# Patient Record
Sex: Female | Born: 1960 | Race: White | Hispanic: No | Marital: Married | Smoking: Never smoker
Health system: Southern US, Community
[De-identification: ages and names within clinical notes are randomized; demographics above are authoritative.]

## PROBLEM LIST (undated history)

## (undated) DIAGNOSIS — K811 Chronic cholecystitis: Secondary | ICD-10-CM

## (undated) DIAGNOSIS — Z8719 Personal history of other diseases of the digestive system: Secondary | ICD-10-CM

## (undated) DIAGNOSIS — R52 Pain, unspecified: Secondary | ICD-10-CM

## (undated) DIAGNOSIS — K219 Gastro-esophageal reflux disease without esophagitis: Secondary | ICD-10-CM

## (undated) HISTORY — PX: ABDOMINAL HYSTERECTOMY: SHX81

---

## 1994-05-07 HISTORY — PX: ABDOMINAL HYSTERECTOMY: SHX81

## 1998-04-18 ENCOUNTER — Encounter: Admission: RE | Admit: 1998-04-18 | Discharge: 1998-07-17 | Payer: Self-pay | Admitting: Anesthesiology

## 1998-08-30 ENCOUNTER — Other Ambulatory Visit: Admission: RE | Admit: 1998-08-30 | Discharge: 1998-08-30 | Payer: Self-pay | Admitting: Obstetrics and Gynecology

## 2001-06-18 ENCOUNTER — Encounter (INDEPENDENT_AMBULATORY_CARE_PROVIDER_SITE_OTHER): Payer: Self-pay | Admitting: Specialist

## 2001-06-18 ENCOUNTER — Observation Stay (HOSPITAL_COMMUNITY): Admission: RE | Admit: 2001-06-18 | Discharge: 2001-06-19 | Payer: Self-pay | Admitting: Obstetrics and Gynecology

## 2003-07-24 ENCOUNTER — Emergency Department (HOSPITAL_COMMUNITY): Admission: EM | Admit: 2003-07-24 | Discharge: 2003-07-24 | Payer: Self-pay

## 2004-08-09 ENCOUNTER — Encounter: Admission: RE | Admit: 2004-08-09 | Discharge: 2004-08-09 | Payer: Self-pay | Admitting: Family Medicine

## 2006-06-03 ENCOUNTER — Encounter: Admission: RE | Admit: 2006-06-03 | Discharge: 2006-06-03 | Payer: Self-pay | Admitting: Obstetrics and Gynecology

## 2008-04-07 ENCOUNTER — Encounter: Admission: RE | Admit: 2008-04-07 | Discharge: 2008-04-07 | Payer: Self-pay | Admitting: Cardiology

## 2009-10-24 ENCOUNTER — Ambulatory Visit (HOSPITAL_BASED_OUTPATIENT_CLINIC_OR_DEPARTMENT_OTHER): Admission: RE | Admit: 2009-10-24 | Discharge: 2009-10-24 | Payer: Self-pay | Admitting: Family Medicine

## 2009-10-24 ENCOUNTER — Ambulatory Visit: Payer: Self-pay | Admitting: Diagnostic Radiology

## 2010-09-22 NOTE — Op Note (Signed)
[facility]  Patient:    Elaine Fletcher, Elaine Fletcher Visit Number: [id number] MRN: [id number]          Service Type: DSU Location: [id number] Attending Physician:  Morene Antu Dictated by:   Sherry A. Rosalio Macadamia, M.D. Proc. Date: 06/18/01 Admit Date:  06/18/2001                             Operative Report  PREOPERATIVE DIAGNOSES:       1. Menorrhagia.                               2. Fibroid uterus.                               3. Left ovarian cyst.  POSTOPERATIVE DIAGNOSES:      1. Menorrhagia.                               2. Fibroid uterus.                               3. Left ovarian cyst.                               4. Left endometrioma.  PROCEDURES:                   1. Laparoscopically assisted vaginal                                  hysterectomy.                               2. Left salpingo-oophorectomy.                               3. Right salpingectomy.  SURGEON:                      1. Sherry A. Rosalio Macadamia, M.D.                               2. Genia Del, M.D.  ANESTHESIA:                   General.  INDICATIONS:                  This is a 50 year old G2, P2-0-0-2 woman who has had excessively heavy flow which has been getting worse for the past few years.  The patient has had known fibroids for a few years.  However, they have increased in size recently.  The patient also has left lower quadrant pain with a persistent left ovarian cyst, although the left ovarian cyst has gotten slightly smaller and has been persistent for a couple of years. Because of this, the patient is brought to the operating room for LAVH and LSO.  FINDINGS:  A 10-week-sized, anteflexed uterus with irregular fibroids present.  Normal right ovary.  Normal left fallopian tube status post Falope ring.  Left ovary with approximately 3 cm endometrioma present.  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  Normal appendix.  Normal upper exploration with full-looking gallbladder.  DESCRIPTION OF PROCEDURE:     The patient was brought into the operating room and given adequate general anesthesia.  She was placed in the dorsal lithotomy position.  Her abdomen perineum and vagina were washed with Hibiclens.  A Foley catheter was placed in the bladder.  A speculum was placed in the vagina.  A single-tooth Hulka tenaculum was placed in the cervix in anteflexed fashion.  The speculum was removed.  The surgeons gown and gloves were changed.  The patient was draped in a sterile fashion.  A subumbilical incision was infiltrated with 0.25% Marcaine.  An incision was made.  A Veress needle was introduced into the peritoneal cavity.  Its placement was checked with saline.  Approximately 3 L of carbon dioxide was insufflated.  The Veress needle was removed.  A laparoscopic trocar was introduced into the peritoneal cavity without difficulty.  A laparoscope was placed and positive identification of the pelvic organs was made.  A right lateral incision was made after infiltrating with 0.25% Marcaine and a trocar was placed under direct visualization.  The same procedure was performed on the left.  The pelvis was inspected and pictures were obtained.  It was felt that an LAVH could be performed.  The right round ligament was cauterized with the tripolar cautery in three places and cut in the middle.  The cautery was continued down to the fallopian tube.  Initially, it was felt that the fallopian tube should be removed only at the portion from the Falope ring back, so cautery was performed beneath the fallopian tube to be included with the specimen.  The utero-ovarian ligaments were cauterized with tripolar and cut.  Some bleeding was present from back bleeding.  This was cauterized for hemostasis.  The ureters on both sides were visualized and were deep in the pelvis.  The  left tube and ovary were to be removed.  The left round ligament was then cauterized with tripolar in three places and cut in the middle.  The left infundibulopelvic ligament was identified.  It was cauterized x 3 in successive bites then cut.  Dissection was continued beneath the ovary to the uterus.  All bleeders were cauterized.  The bladder flap was then identified. A small incision was made in it near the cervix and, using the Nezhat, the bladder flap was hydrodissected.  Using Metzenbaums, a peritoneal incision was made from this midline incision on both sides.  The bladder was then dissected with blunt dissection down off of the cervix.  Bleeders were cauterized. Adequate hemostasis was felt to be present.  The ureters were then again re identified and some more cautery was taken with the tripolar, still above the uterine arteries, but close in to the uterus and at the cervicouterine junction.  This was done on both sides.  Adequate hemostasis was present at this time.  The ureters were felt to be below the cautery area and were felt to be normal.  The carbon dioxide was allowed to escape and the instruments removed from the abdominal cavity.  The vaginal portion of the surgery was then performed.  The patient was prepared for the vaginal procedure.  A weighted  speculum was placed in the vagina.  The cervix was grasped with two Loman [place].  The cervix was infiltrated with 1% Xylocaine with epinephrine circumferentially. The cervix was then circumcised and the vaginal mucosa was dissected off the cervix with blunt dissection.  The posterior cul-de-sac was entered sharply and a weighted speculum was placed within this space after removing the first one.  Using Heaney clamps, the uterosacral ligaments were clamped, cut and suture ligated with 0 Vicryl ligature.  Then, using the LigaSure, tissues were clamped, cauterized and then cut on alternating sides.  The bladder  was developed off of the lower uterine segment with blunt dissection and a retractor was placed beneath the bladder.  The LigaSure was continued to be  used on alternating sides until all of the cardinal ligaments were cauterized and cut.  The uterus was removed with the left tube and ovary intact. However, the endometrioma had ruptured.  The pelvis was irrigated and suctioned.  Small bleeders were present along some of the cauterized edges. These were closed with 0 Vicryl figure-of-eight stitches.  The posterior cuff was then closed with 0 Vicryl in a running lock stitch.  A McCall stitch was taken with 0 Vicryl from the uterosacral ligaments across the rectum and tied in the midline.  The bladder peritoneum was identified and the bladder peritoneum was closed with the vaginal mucosa in figure-of-eight stitches with 0 Vicryl at the anterior portion.  Then, the entire vaginal cuff was closed with 0 Vicryl in figure-of-eight stitches in a vertical fashion.  Adequate hemostasis was present.  All instruments were removed from the vagina.  The patient was then prepared for the second laparoscopic portion of the surgery. The surgeons gloves were changed.  The carbon dioxide was then allowed rule out insufflate and the laparoscope was placed.  There was the hydatid of Morgagni on the right fallopian tube. Initially, we were going to take out the cyst.  However, we felt that there would be too much bleeding.  Therefore, the tube was elevated and it was cauterized with tripolar cautery beneath it, leaving the ovary intact.  The tissue was cauterized and then cut.  Adequate hemostasis was present.  The ovary was felt to be normal.  The infundibulopelvic ligament was felt to be untouched.  The pelvis was irrigated.  There were small bleeders in certain areas near the bladder peritoneum.  These were cauterized.  There was a small amount of bleeding near the cuff of the vagina.  A small piece of  Surgicel was cut and was introduced through the left port.  The Surgicel was placed in the cuff.  Adequate hemostasis was then present.  The pelvis had been irrigated. The upper abdomen was inspected.  The appendix was visualized.  All irrigation had been removed.  Adequate hemostasis was then again noted.  The right trocar and sleeve were removed under direct visualization.  The laparoscope was removed under direct visualization after removing all carbon dioxide.  The left sleeve was removed after all carbon dioxide had escaped.  The subumbilical incision was then closed by grasping the fascia with an Allis clamp.  The fascia was closed with 0 Vicryl in an interrupted stitch.  The skin was then closed with 4-0 Monocryl in a subcuticular stitch in the subumbilical incision.  All three incisions were then closed with Dermabond. Adequate hemostasis was present.  The patient was taken out of the dorsal lithotomy position.  She was awakened.  She was extubated.  She  was moved from the operating table to a stretcher in stable condition.  COMPLICATIONS:                None.  ESTIMATED BLOOD LOSS:         100 cc. Dictated by:   Sherry A. Rosalio Macadamia, M.D. Attending Physician:  Morene Antu DD:  06/18/01 TD:  06/18/01 Job: [id number]

## 2011-01-12 ENCOUNTER — Other Ambulatory Visit: Payer: Self-pay

## 2011-01-12 ENCOUNTER — Encounter: Payer: Self-pay | Admitting: Emergency Medicine

## 2011-01-12 ENCOUNTER — Emergency Department (HOSPITAL_BASED_OUTPATIENT_CLINIC_OR_DEPARTMENT_OTHER)
Admission: EM | Admit: 2011-01-12 | Discharge: 2011-01-12 | Disposition: A | Discharge status: Home / Self Care | Payer: BC Managed Care – PPO | Attending: Emergency Medicine | Admitting: Emergency Medicine

## 2011-01-12 ENCOUNTER — Emergency Department (INDEPENDENT_AMBULATORY_CARE_PROVIDER_SITE_OTHER): Payer: BC Managed Care – PPO

## 2011-01-12 DIAGNOSIS — M549 Dorsalgia, unspecified: Secondary | ICD-10-CM

## 2011-01-12 DIAGNOSIS — R079 Chest pain, unspecified: Secondary | ICD-10-CM | POA: Insufficient documentation

## 2011-01-12 DIAGNOSIS — R091 Pleurisy: Secondary | ICD-10-CM

## 2011-01-12 DIAGNOSIS — R0602 Shortness of breath: Secondary | ICD-10-CM

## 2011-01-12 LAB — COMPREHENSIVE METABOLIC PANEL
Alkaline Phosphatase: 72 U/L (ref 39–117)
BUN: 14 mg/dL (ref 6–23)
Creatinine, Ser: 0.7 mg/dL (ref 0.50–1.10)
GFR calc Af Amer: >60 mL/min (ref >60)
Glucose, Bld: 96 mg/dL (ref 70–99)
Potassium: 4.1 mEq/L (ref 3.5–5.1)
Total Protein: 6.6 g/dL (ref 6.0–8.3)

## 2011-01-12 LAB — CBC
HCT: 38.8 % (ref 36.0–46.0)
Hemoglobin: 13.3 g/dL (ref 12.0–15.0)
MCH: 31.1 pg (ref 26.0–34.0)
MCHC: 34.3 g/dL (ref 30.0–36.0)
MCV: 90.9 fL (ref 78.0–100.0)

## 2011-01-12 LAB — CARDIAC PANEL(CRET KIN+CKTOT+MB+TROPI)
Relative Index: INVALID (ref 0.0–2.5)
Total CK: 64 U/L (ref 7–177)

## 2011-01-12 MED ORDER — KETOROLAC TROMETHAMINE 30 MG/ML IJ SOLN
30.0000 mg | Freq: Once | INTRAMUSCULAR | Status: AC
  Administered 2011-01-12: 30 mg via INTRAVENOUS
  Filled 2011-01-12: qty 1

## 2011-01-12 MED ORDER — IOHEXOL 350 MG/ML SOLN
80.0000 mL | Freq: Once | INTRAVENOUS | Status: AC | PRN
  Administered 2011-01-12: 80 mL via INTRAVENOUS

## 2011-01-12 MED ORDER — TRAMADOL HCL 50 MG PO TABS: 50.0000 mg | ORAL_TABLET | Freq: Four times a day (QID) | ORAL | Status: AC | PRN

## 2011-01-12 NOTE — ED Notes (Signed)
Pt c/o awaking with pain in left axillary area x several days. Pt reports pain happens at night and resolves by morning. Pt reports shob associated with pain and pain increased with inspiration.

## 2011-01-12 NOTE — ED Provider Notes (Addendum)
History     CSN: [id number] Arrival date & time: 01/12/2011  4:14 AM  Chief Complaint  Patient presents with  . Chest Pain   Patient is a 50 y.o. female presenting with chest pain. The history is provided by the patient. No language interpreter was used.  Chest Pain The chest pain began more than 2 weeks ago (3 weeks ago). Duration of episode(s) is 3 weeks. Chest pain occurs constantly. The pain is associated with breathing (laying flat). At its most intense, the pain is at 8/10. The pain is currently at 8/10. The quality of the pain is described as sharp and pressure-like. Radiates to: left chest into the left axilla. Chest pain is worsened by certain positions and deep breathing. Primary symptoms include shortness of breath and cough. Pertinent negatives for primary symptoms include no fever, no fatigue, no syncope, no wheezing, no palpitations, no abdominal pain, no nausea, no vomiting, no dizziness and no altered mental status.  Pertinent negatives for associated symptoms include no claudication, no diaphoresis, no lower extremity edema, no near-syncope, no numbness, no orthopnea, no paroxysmal nocturnal dyspnea and no weakness. She tried nothing for the symptoms. Risk factors include no known risk factors.  Pertinent negatives for past medical history include no aneurysm, no anxiety/panic attacks, no aortic aneurysm, no aortic dissection, no arrhythmia, no bicuspid aortic valve, no CAD, no cancer, no congenital heart disease, no connective tissue disease, no COPD, no CHF, no diabetes, no DVT, no hyperlipidemia, no Marfan's syndrome, no MI, no mitral valve prolapse, no pacemaker, no PE, no PVD, no recent injury, no rheumatic fever, no seizures, no sickle cell disease, no sleep apnea, no spontaneous pneumothorax, no stimulant use, no strokes and no thyroid problem.  Her family medical history is significant for heart disease in family.  Pertinent negatives for family medical history include: no  sudden death in family.  Procedure history is negative for cardiac catheterization, echocardiogram, persantine thallium, stress echo, stress thallium and exercise treadmill test.     History reviewed. No pertinent past medical history.  Past Surgical History  Procedure Date  . Abdominal hysterectomy     History reviewed. No pertinent family history.  History  Substance Use Topics  . Smoking status: Never Smoker   . Smokeless tobacco: Not on file  . Alcohol Use: No    OB History    Grav Para Term Preterm Abortions TAB SAB Ect Mult Living                  Review of Systems  Constitutional: Negative for fever, diaphoresis and fatigue.  Respiratory: Positive for cough and shortness of breath. Negative for wheezing.   Cardiovascular: Positive for chest pain. Negative for palpitations, orthopnea, claudication, syncope and near-syncope.  Gastrointestinal: Negative for nausea, vomiting, abdominal pain and abdominal distention.  Genitourinary: Negative for dysuria and difficulty urinating.  Musculoskeletal: Negative.   Neurological: Negative for dizziness, seizures, weakness and numbness.  Hematological: Negative.   Psychiatric/Behavioral: Negative.  Negative for altered mental status.    Physical Exam  BP 128/62  Pulse 80  Temp(Src) 97.9 F (36.6 C) (Oral)  Resp 16  SpO2 100%  Physical Exam  Constitutional: She is oriented to person, place, and time. She appears well-developed and well-nourished.  HENT:  Head: Normocephalic and atraumatic.  Eyes: EOM are normal. Pupils are equal, round, and reactive to light. Right eye exhibits no discharge. Left eye exhibits no discharge.  Neck: Normal range of motion. Neck supple.  Cardiovascular: Normal rate and  regular rhythm.  Exam reveals no gallop and no friction rub.   No murmur heard. Pulmonary/Chest: Effort normal and breath sounds normal. No respiratory distress. She exhibits tenderness.       Left midaxillary line    Abdominal: Soft. Bowel sounds are normal.  Musculoskeletal: Normal range of motion. She exhibits no edema and no tenderness.  Neurological: She is alert and oriented to person, place, and time.  Skin: Skin is warm.  Psychiatric: She has a normal mood and affect.    ED Course  Procedures  MDM  Date: 01/12/2011  Rate:69  Rhythm: normal sinus rhythm  QRS Axis: normal  Intervals: normal  ST/T Wave abnormalities: normal  Conduction Disutrbances:none  Narrative Interpretation: normal  Old EKG Reviewed: none available    Return for worsening chest pain, shortness of breath or any concerns.  Follow up with your family doctor and cardiology for outpatient stress test, patient expresses understanding and agrees to follow up   Jari Carollo K Dyneshia Baccam-Rasch, MD 01/12/11 [id number]  Jasmine Awe, MD 01/12/11 [phone]

## 2013-08-04 ENCOUNTER — Other Ambulatory Visit: Payer: Self-pay | Admitting: Family

## 2013-08-04 DIAGNOSIS — Z1231 Encounter for screening mammogram for malignant neoplasm of breast: Secondary | ICD-10-CM

## 2013-09-05 IMAGING — CT CT ANGIO CHEST
2 of 6 series · 19 of 36 positions shown · IV contrast (APPLIED)
Comparison: None.

CLINICAL DATA: Left chest pain, back pain, short of breath, recent
long airplane trip.

CT ANGIOGRAPHY CHEST WITH CONTRAST
TECHNIQUE: Multidetector CT imaging of the chest was performed
using the standard protocol during bolus administration of
intravenous contrast.  Multiplanar CT image reconstructions
including MIPs were obtained to evaluate the vascular anatomy.
Contrast:  80 ml Omnipaque 3 feet D

[Series 5: pe 1.0 b25f · axial · 0.62mm/px · z∈[-234,-9]mm · 18 of 251 slices shown]
[im 13/251  lung]
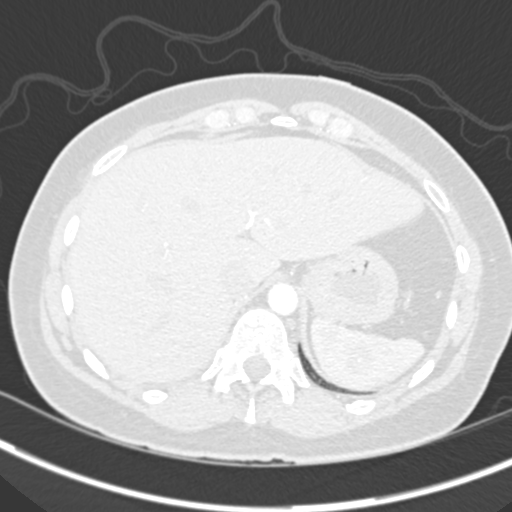
[im 26/251  mediastinal]
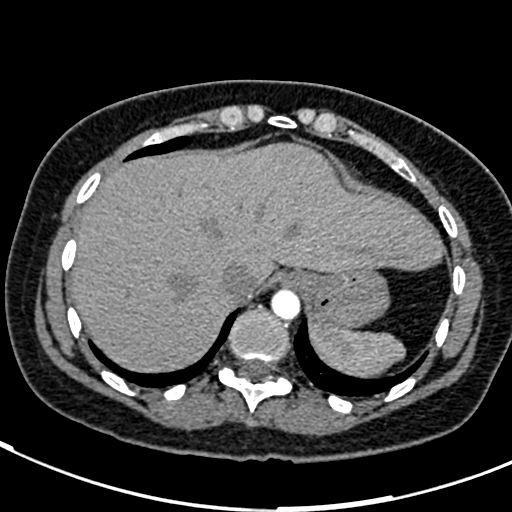
[im 38/251  lung]
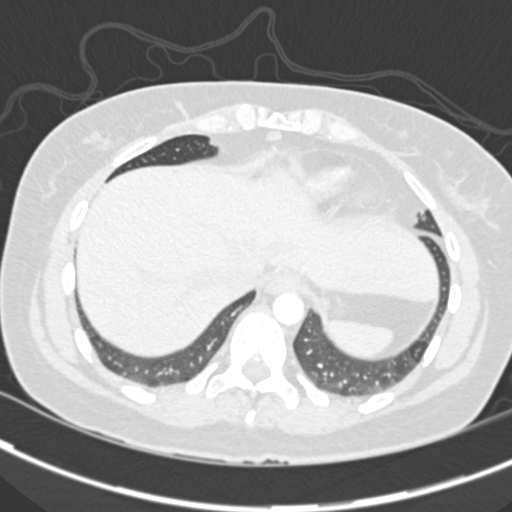
[im 51/251  mediastinal]
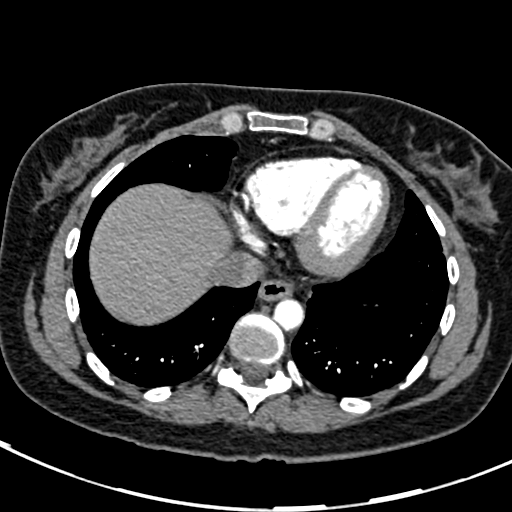
[im 63/251  lung]
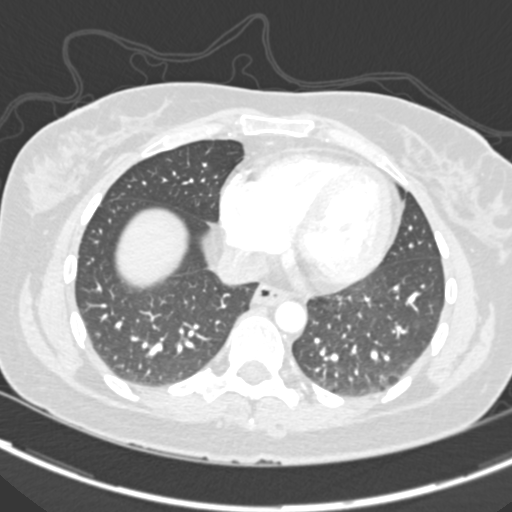
[im 76/251  mediastinal]
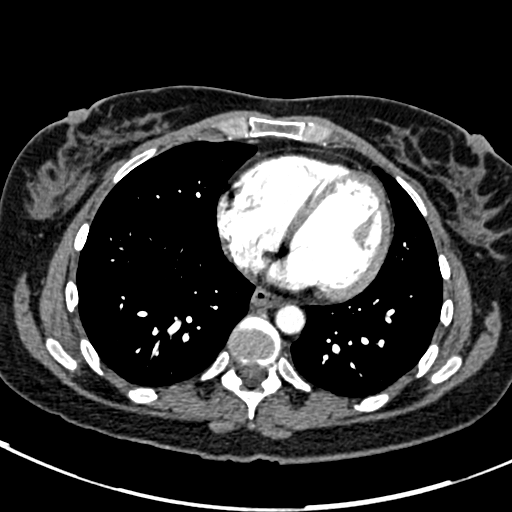
[im 88/251  lung]
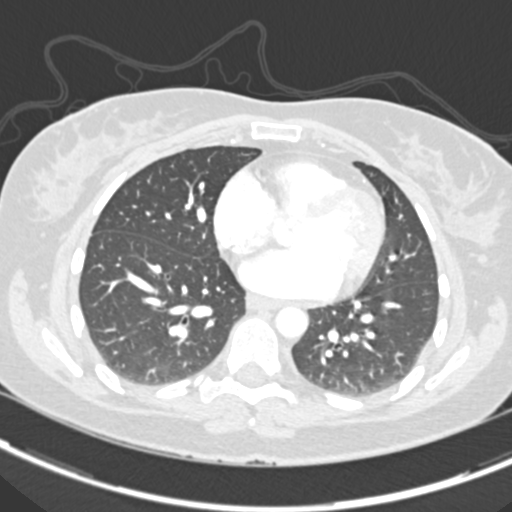
[im 101/251  mediastinal]
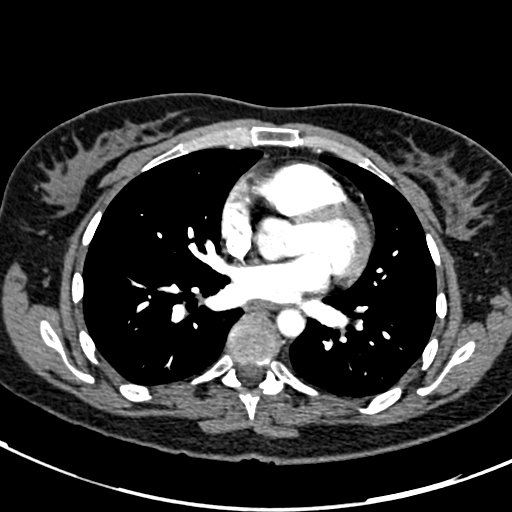
[im 113/251  lung]
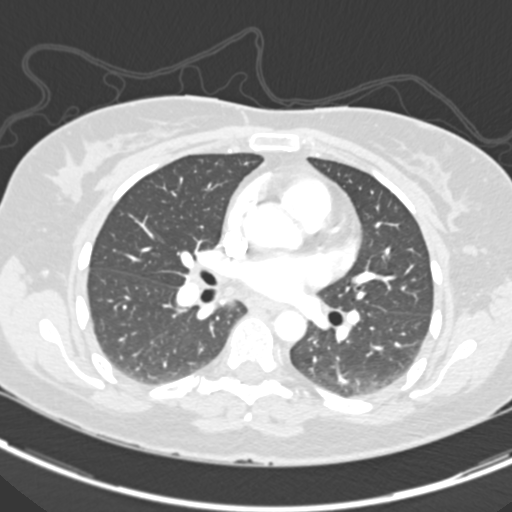
[im 138/251  mediastinal]
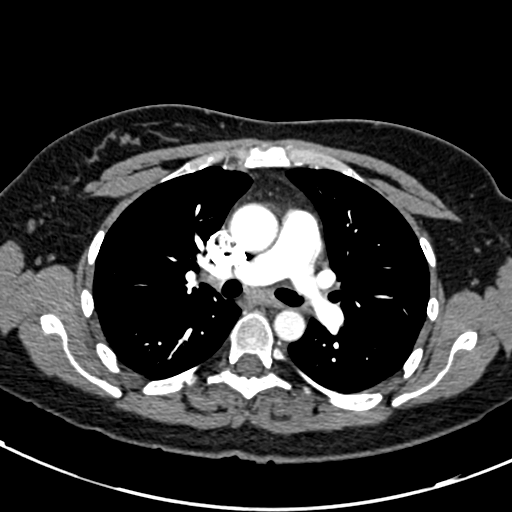
[im 151/251  lung]
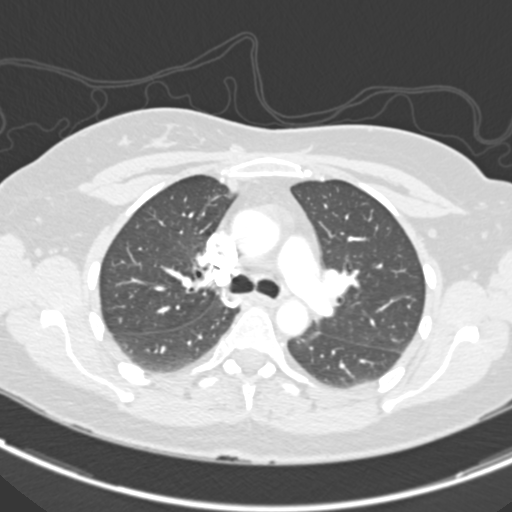
[im 163/251  mediastinal]
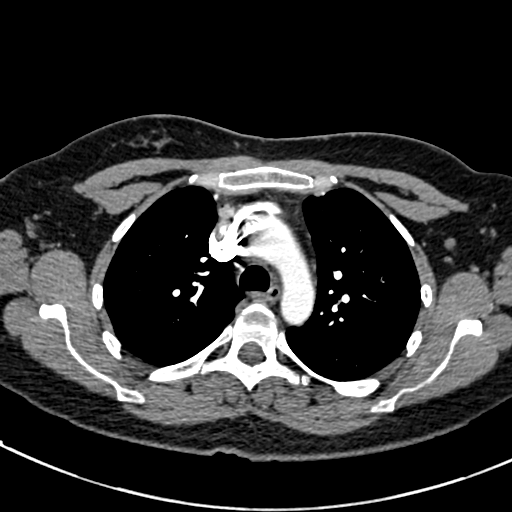
[im 176/251  lung]
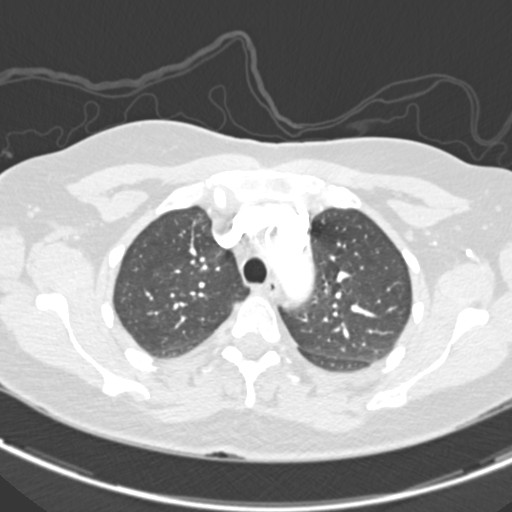
[im 188/251  mediastinal]
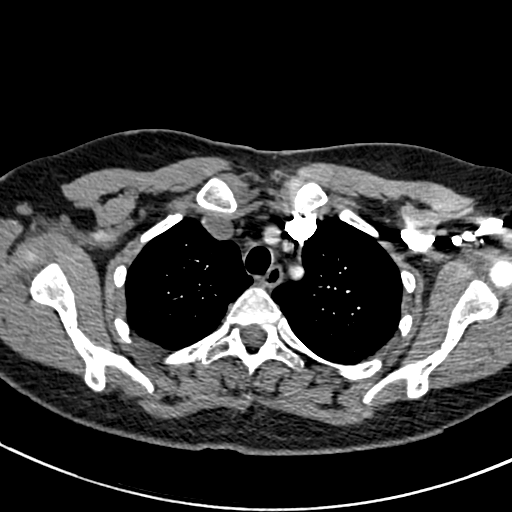
[im 201/251  lung]
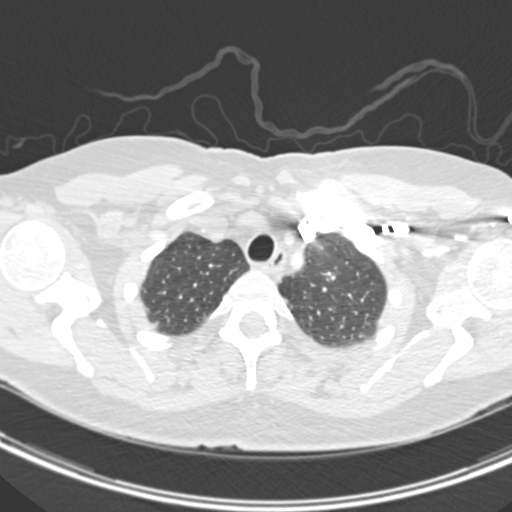
[im 213/251  mediastinal]
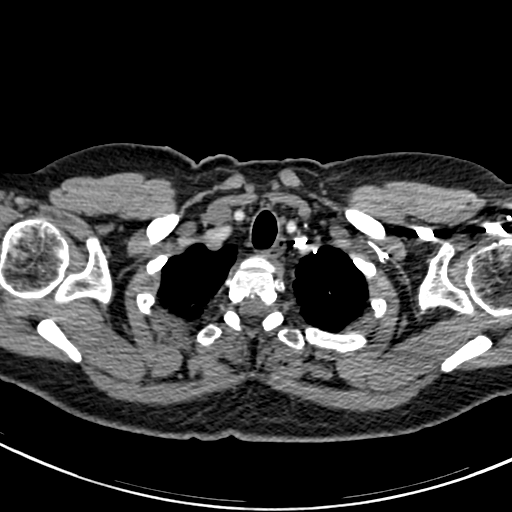
[im 226/251  lung]
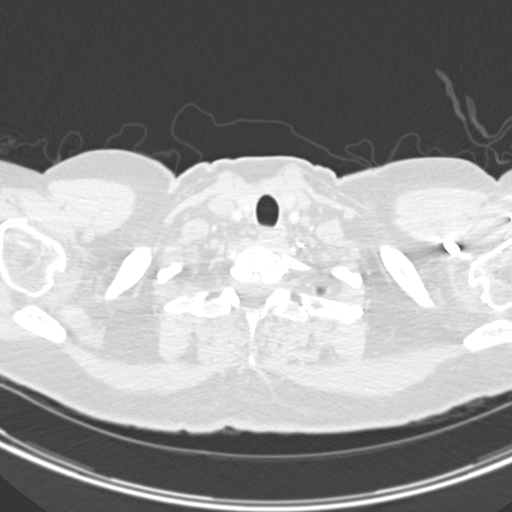
[im 238/251  mediastinal]
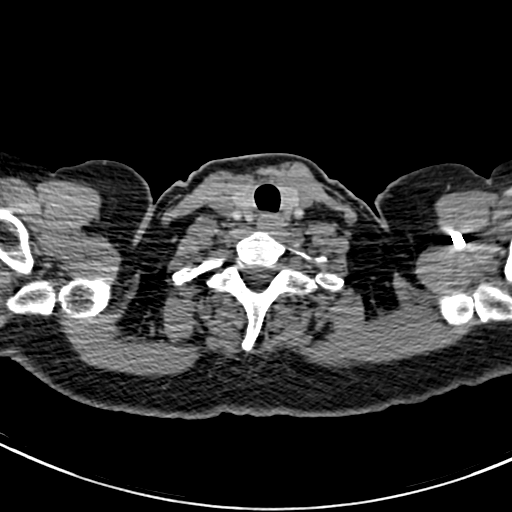

[Series 8: pe 2.0 coronal · coronal · 0.49mm/px · 1 of 111 slices shown]
[im 56/111  mediastinal]
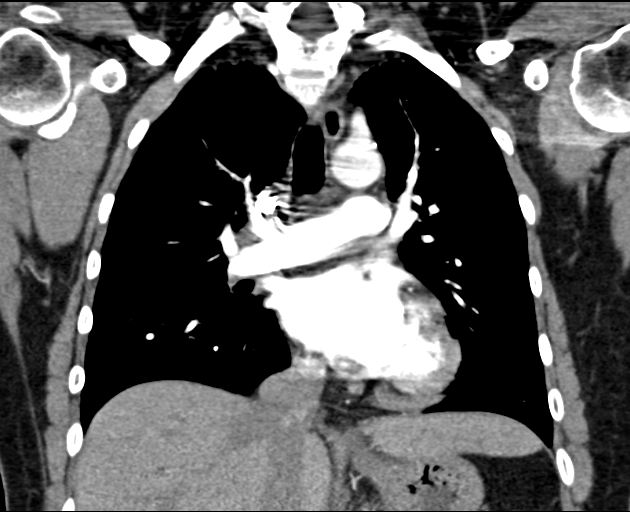

[19 of 36 positions shown; findings below may reference images not displayed]

FINDINGS: Technically adequate study with good opacification of
the central and segmental pulmonary arteries.  No focal filling
defects demonstrated.  No evidence of significant pulmonary
embolus.  Normal caliber thoracic aorta without dissection.  No
significant lymphadenopathy in the chest.  No pleural effusions.
Heterogeneous splenic enhancement probably related to early
arterial phase contrast bolus.  Small bleb in the medial aspect of
the right upper lung.  No focal airspace consolidation.  Minimal
dependent atelectasis.  Normal alignment of the thoracic spine.
Sternum appears intact.

Review of the MIP images confirms the above findings.
IMPRESSION: No evidence of significant pulmonary embolus.

## 2013-09-05 IMAGING — CR DG CHEST 2V
2 series · 2 of 2 positions shown · non-contrast
Comparison: 07/24/2003

CLINICAL DATA: Left chest pain, back pain and short of breath

CHEST - 2 VIEW

[w chest pa]
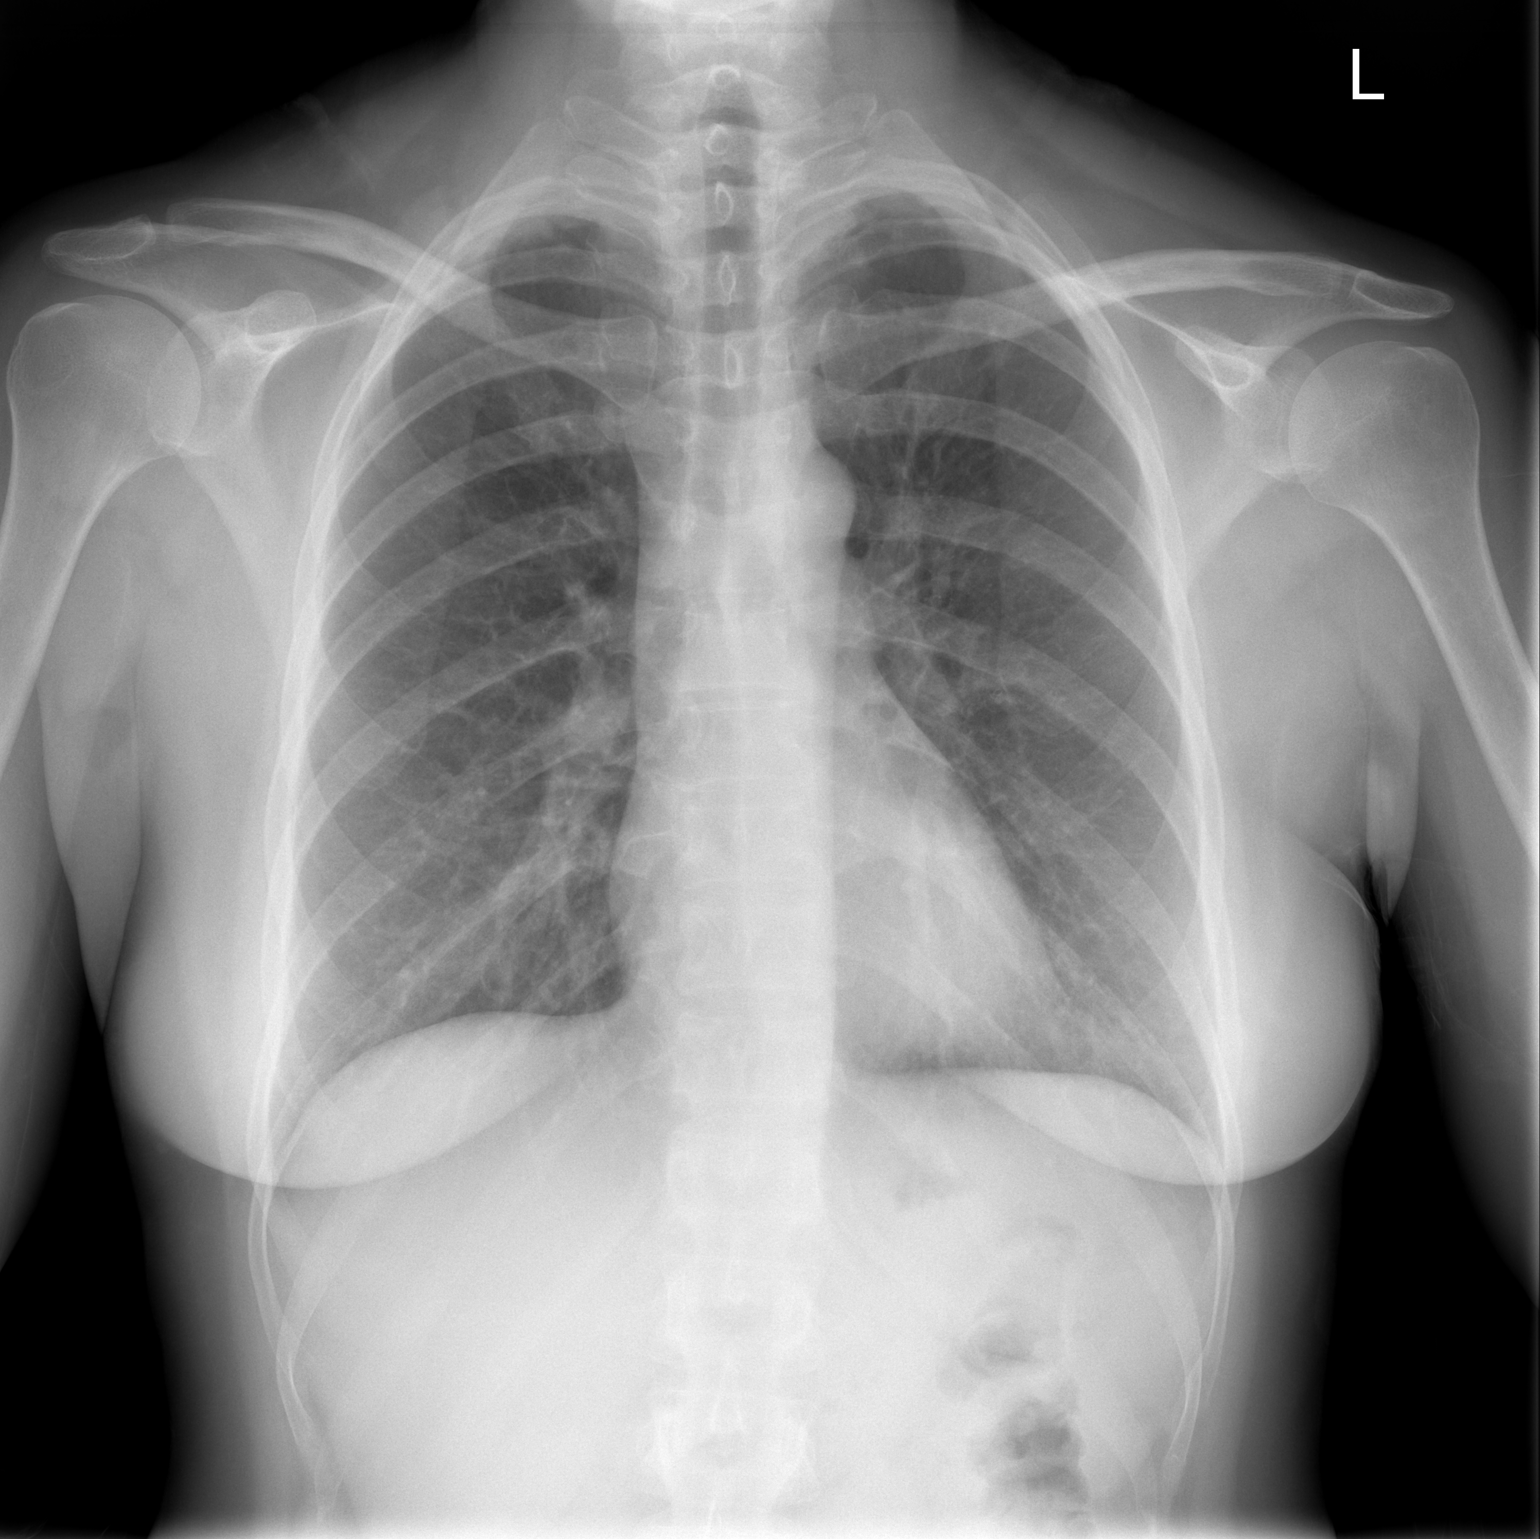

[w chest lat]
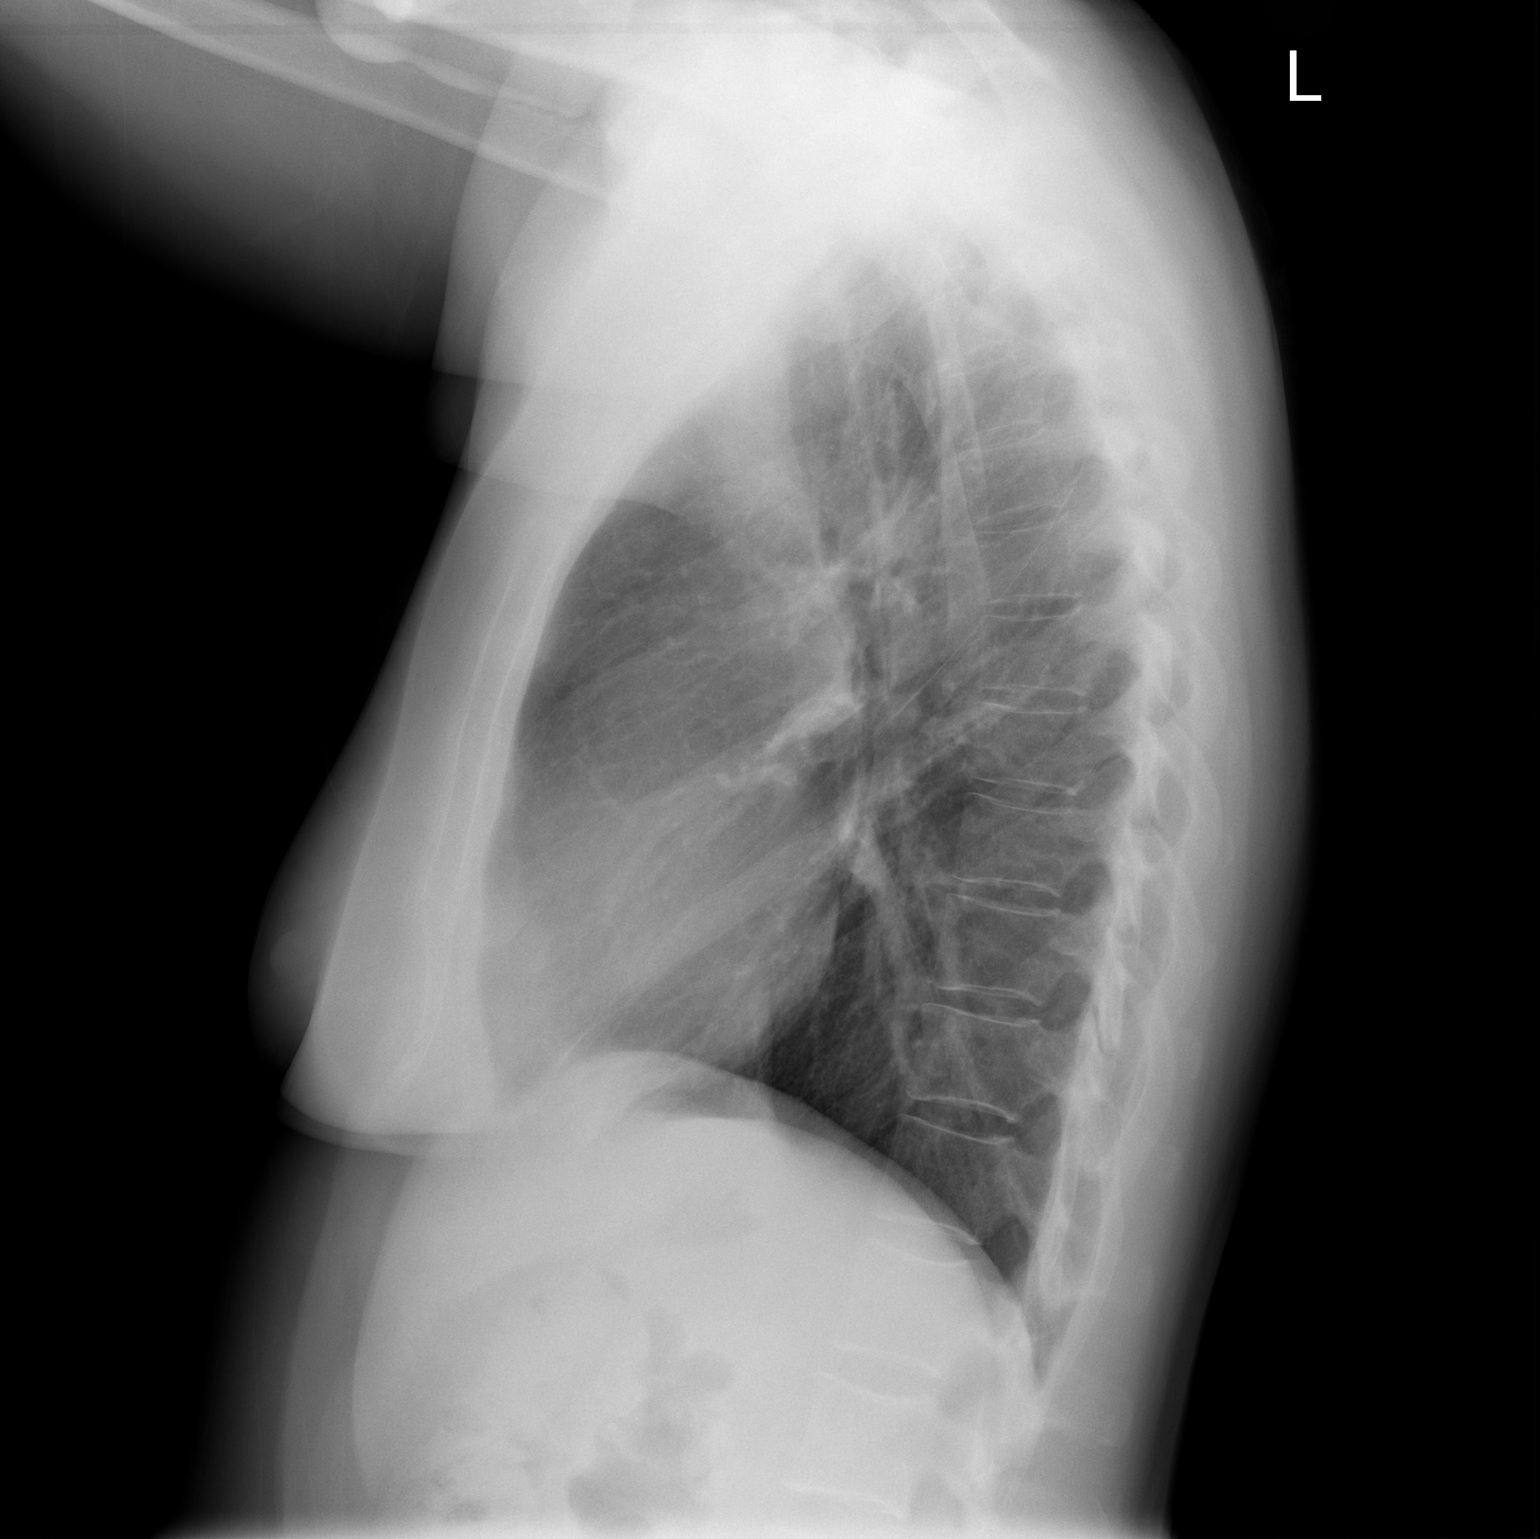

[2 of 2 positions shown; findings below may reference images not displayed]

FINDINGS: The heart size and pulmonary vascularity are normal. The
lungs appear clear and expanded without focal air space disease or
consolidation. No blunting of the costophrenic angles.  No
significant change since previous study
IMPRESSION: No evidence of active pulmonary disease.

## 2013-11-11 ENCOUNTER — Encounter (INDEPENDENT_AMBULATORY_CARE_PROVIDER_SITE_OTHER): Payer: Self-pay

## 2013-11-11 ENCOUNTER — Ambulatory Visit
Admission: RE | Admit: 2013-11-11 | Discharge: 2013-11-11 | Disposition: A | Discharge status: Home / Self Care | Payer: BC Managed Care – PPO | Source: Ambulatory Visit | Attending: Family | Admitting: Family

## 2013-11-11 DIAGNOSIS — Z1231 Encounter for screening mammogram for malignant neoplasm of breast: Secondary | ICD-10-CM

## 2014-02-19 ENCOUNTER — Other Ambulatory Visit: Payer: Self-pay

## 2017-02-13 ENCOUNTER — Ambulatory Visit (HOSPITAL_BASED_OUTPATIENT_CLINIC_OR_DEPARTMENT_OTHER)
Admission: RE | Admit: 2017-02-13 | Discharge: 2017-02-13 | Disposition: A | Discharge status: Home / Self Care | Payer: BC Managed Care – PPO | Source: Ambulatory Visit | Attending: Family Medicine | Admitting: Family Medicine

## 2017-02-13 ENCOUNTER — Other Ambulatory Visit: Payer: Self-pay | Admitting: Family Medicine

## 2017-02-13 DIAGNOSIS — R519 Headache, unspecified: Secondary | ICD-10-CM

## 2017-02-13 DIAGNOSIS — R51 Headache: Secondary | ICD-10-CM | POA: Diagnosis not present

## 2017-02-13 DIAGNOSIS — R42 Dizziness and giddiness: Secondary | ICD-10-CM | POA: Diagnosis present

## 2018-01-30 ENCOUNTER — Other Ambulatory Visit: Payer: Self-pay | Admitting: Sports Medicine

## 2018-01-30 ENCOUNTER — Ambulatory Visit
Admission: RE | Admit: 2018-01-30 | Discharge: 2018-01-30 | Disposition: A | Discharge status: Home / Self Care | Payer: BC Managed Care – PPO | Source: Ambulatory Visit | Attending: Sports Medicine | Admitting: Sports Medicine

## 2018-01-30 DIAGNOSIS — M7989 Other specified soft tissue disorders: Secondary | ICD-10-CM

## 2018-01-30 DIAGNOSIS — M79604 Pain in right leg: Secondary | ICD-10-CM

## 2018-12-12 ENCOUNTER — Other Ambulatory Visit: Payer: Self-pay | Admitting: Family Medicine

## 2018-12-12 DIAGNOSIS — Z1231 Encounter for screening mammogram for malignant neoplasm of breast: Secondary | ICD-10-CM

## 2019-01-28 ENCOUNTER — Other Ambulatory Visit: Payer: Self-pay

## 2019-01-28 ENCOUNTER — Ambulatory Visit
Admission: RE | Admit: 2019-01-28 | Discharge: 2019-01-28 | Disposition: A | Discharge status: Home / Self Care | Payer: BC Managed Care – PPO | Source: Ambulatory Visit | Attending: Family Medicine | Admitting: Family Medicine

## 2019-01-28 DIAGNOSIS — Z1231 Encounter for screening mammogram for malignant neoplasm of breast: Secondary | ICD-10-CM

## 2020-03-14 ENCOUNTER — Other Ambulatory Visit: Payer: Self-pay

## 2020-03-14 ENCOUNTER — Encounter (HOSPITAL_BASED_OUTPATIENT_CLINIC_OR_DEPARTMENT_OTHER): Payer: Self-pay | Admitting: *Deleted

## 2020-03-14 ENCOUNTER — Emergency Department (HOSPITAL_BASED_OUTPATIENT_CLINIC_OR_DEPARTMENT_OTHER)
Admission: EM | Admit: 2020-03-14 | Discharge: 2020-03-14 | Disposition: A | Discharge status: Home / Self Care | Payer: BC Managed Care – PPO | Attending: Emergency Medicine | Admitting: Emergency Medicine

## 2020-03-14 DIAGNOSIS — Z0279 Encounter for issue of other medical certificate: Secondary | ICD-10-CM

## 2020-03-14 DIAGNOSIS — M2540 Effusion, unspecified joint: Secondary | ICD-10-CM | POA: Diagnosis not present

## 2020-03-14 DIAGNOSIS — R21 Rash and other nonspecific skin eruption: Secondary | ICD-10-CM | POA: Insufficient documentation

## 2020-03-14 DIAGNOSIS — R1011 Right upper quadrant pain: Secondary | ICD-10-CM | POA: Insufficient documentation

## 2020-03-14 DIAGNOSIS — M25532 Pain in left wrist: Secondary | ICD-10-CM | POA: Diagnosis present

## 2020-03-14 DIAGNOSIS — M791 Myalgia, unspecified site: Secondary | ICD-10-CM | POA: Insufficient documentation

## 2020-03-14 LAB — COMPREHENSIVE METABOLIC PANEL
ALT: 29 U/L (ref 0–44)
AST: 27 U/L (ref 15–41)
Albumin: 4.2 g/dL (ref 3.5–5.0)
Alkaline Phosphatase: 84 U/L (ref 38–126)
Anion gap: 12 (ref 5–15)
BUN: 15 mg/dL (ref 6–20)
CO2: 24 mmol/L (ref 22–32)
Calcium: 9.4 mg/dL (ref 8.9–10.3)
Chloride: 104 mmol/L (ref 98–111)
Creatinine, Ser: 0.97 mg/dL (ref 0.44–1.00)
GFR, Estimated: >60 mL/min (ref >60)
Glucose, Bld: 94 mg/dL (ref 70–99)
Potassium: 3.9 mmol/L (ref 3.5–5.1)
Sodium: 140 mmol/L (ref 135–145)
Total Bilirubin: 2.5 mg/dL — ABNORMAL HIGH (ref 0.3–1.2)
Total Protein: 7.4 g/dL (ref 6.5–8.1)

## 2020-03-14 LAB — CBC WITH DIFFERENTIAL/PLATELET
Abs Immature Granulocytes: 0.01 10*3/uL (ref 0.00–0.07)
Basophils Absolute: 0.1 10*3/uL (ref 0.0–0.1)
Basophils Relative: 1 %
Eosinophils Absolute: 0.2 10*3/uL (ref 0.0–0.5)
Eosinophils Relative: 4 %
HCT: 44 % (ref 36.0–46.0)
Hemoglobin: 14.6 g/dL (ref 12.0–15.0)
Immature Granulocytes: 0 %
Lymphocytes Relative: 36 %
Lymphs Abs: 2.1 10*3/uL (ref 0.7–4.0)
MCH: 30.4 pg (ref 26.0–34.0)
MCHC: 33.2 g/dL (ref 30.0–36.0)
MCV: 91.7 fL (ref 80.0–100.0)
Monocytes Absolute: 0.7 10*3/uL (ref 0.1–1.0)
Monocytes Relative: 11 %
Neutro Abs: 2.9 10*3/uL (ref 1.7–7.7)
Neutrophils Relative %: 48 %
Platelets: 282 10*3/uL (ref 150–400)
RBC: 4.8 MIL/uL (ref 3.87–5.11)
RDW: 12.2 % (ref 11.5–15.5)
WBC: 6 10*3/uL (ref 4.0–10.5)
nRBC: 0 % (ref 0.0–0.2)

## 2020-03-14 LAB — SEDIMENTATION RATE: Sed Rate: 24 mm/hr — ABNORMAL HIGH (ref 0–22)

## 2020-03-14 LAB — URIC ACID: Uric Acid, Serum: 6 mg/dL (ref 2.5–7.1)

## 2020-03-14 MED ORDER — DIPHENHYDRAMINE HCL 25 MG PO CAPS
25.0000 mg | ORAL_CAPSULE | Freq: Once | ORAL | Status: AC
  Administered 2020-03-14: 25 mg via ORAL
  Filled 2020-03-14: qty 1

## 2020-03-14 MED ORDER — IBUPROFEN 400 MG PO TABS
600.0000 mg | ORAL_TABLET | Freq: Once | ORAL | Status: AC
  Administered 2020-03-14: 600 mg via ORAL
  Filled 2020-03-14: qty 1

## 2020-03-14 MED ORDER — IBUPROFEN 600 MG PO TABS
600.0000 mg | ORAL_TABLET | Freq: Four times a day (QID) | ORAL | 0 refills | Status: DC | PRN
Start: 2020-03-14 — End: 2020-09-08

## 2020-03-14 MED ORDER — PREDNISONE 50 MG PO TABS
60.0000 mg | ORAL_TABLET | Freq: Once | ORAL | Status: AC
  Administered 2020-03-14: 60 mg via ORAL
  Filled 2020-03-14: qty 1

## 2020-03-14 MED ORDER — METHYLPREDNISOLONE 4 MG PO TBPK: ORAL_TABLET | ORAL | 0 refills | Status: DC

## 2020-03-14 NOTE — ED Notes (Signed)
BC x 1 from Lt Hand

## 2020-03-14 NOTE — ED Provider Notes (Signed)
[facility] EMERGENCY DEPARTMENT Provider Note   CSN: [id number] Arrival date & time: 03/14/20  1613     History Chief Complaint  Patient presents with   Pain    Elaine Fletcher is a 59 y.o. female present emergency department with polyarthralgias, fevers and chills.  The patient reports for the past 2 to 3 days she has had migrating joint pain all over her body, specifically in her left wrist and right finger, as well as her left foot.  She says these have come and gone away, but are currently persistent and red and swollen.  This never happened her before.  She describes soaking night sweats last night.  She says approximate 4 to 5 days ago she had a suspected "bug bite" in her left elbow with swelling of her elbow, which improved with ice and Motrin.  Swelling has gone away her elbow no longer hurts.  She tried to schedule appoint with rheumatology but nothing is available for 3 months.  She separately reports that she has had some sharp epigastric and right upper quadrant pain that wraps around towards her back, worsening over the past 2 days.  She had a similar episode occasionally in the past.  She denies any known history of gallstones or biliary disease.    She has no personal history of autoimmune disease.  She has no other significant medical history.  She denies any tick bites.  She is Covid vaccinated.  No hx of gout.  HPI     History reviewed. No pertinent past medical history.  There are no problems to display for this patient.   Past Surgical History:  Procedure Laterality Date   ABDOMINAL HYSTERECTOMY       OB History   No obstetric history on file.     No family history on file.  Social History   Tobacco Use   Smoking status: Never Smoker  Substance Use Topics   Alcohol use: No   Drug use: No    Home Medications Prior to Admission medications   Medication Sig Start Date End Date Taking? Authorizing Provider  ibuprofen (ADVIL)  600 MG tablet Take 1 tablet (600 mg total) by mouth every 6 (six) hours as needed for up to 30 doses for fever, mild pain or moderate pain. 03/14/20   Terald Sleeper, MD  methylPREDNISolone (MEDROL DOSEPAK) 4 MG TBPK tablet Use as directed on package 03/15/20   Terald Sleeper, MD    Allergies    Bupropion  Review of Systems   Review of Systems  Constitutional: Negative for chills and fever.  HENT: Negative for ear pain and sore throat.   Eyes: Negative for pain and visual disturbance.  Respiratory: Negative for cough and shortness of breath.   Cardiovascular: Negative for chest pain and palpitations.  Gastrointestinal: Positive for abdominal pain and nausea. Negative for vomiting.  Genitourinary: Negative for dysuria and hematuria.  Musculoskeletal: Positive for arthralgias, back pain and myalgias.  Skin: Positive for rash. Negative for color change.  Neurological: Negative for syncope and light-headedness.  Psychiatric/Behavioral: Negative for agitation and confusion.  All other systems reviewed and are negative.   Physical Exam Updated Vital Signs BP 121/62 (BP Location: Left Arm)    Pulse 75    Temp 98.6 F (37 C) (Oral)    Resp 15    Ht 5\' 7"  (1.702 m)    Wt 90.7 kg    SpO2 99%    BMI 31.32 kg/m  Physical Exam Vitals and nursing note reviewed.  Constitutional:      General: She is not in acute distress.    Appearance: She is well-developed.  HENT:     Head: Normocephalic and atraumatic.  Eyes:     Conjunctiva/sclera: Conjunctivae normal.     Pupils: Pupils are equal, round, and reactive to light.  Cardiovascular:     Rate and Rhythm: Normal rate and regular rhythm.     Heart sounds: No murmur heard.   Pulmonary:     Effort: Pulmonary effort is normal. No respiratory distress.     Breath sounds: Normal breath sounds.  Abdominal:     Palpations: Abdomen is soft.     Tenderness: There is no abdominal tenderness. There is no guarding or rebound. Negative signs  include Murphy's sign.  Musculoskeletal:     Cervical back: Neck supple.     Comments: Tenderness of finger PIP, left ankle Small erythematous rashes noted overlying these joints  Skin:    General: Skin is warm and dry.  Neurological:     General: No focal deficit present.     Mental Status: She is alert and oriented to person, place, and time.  Psychiatric:        Mood and Affect: Mood normal.        Behavior: Behavior normal.     ED Results / Procedures / Treatments   Labs (all labs ordered are listed, but only abnormal results are displayed) Labs Reviewed  SEDIMENTATION RATE - Abnormal; Notable for the following components:      Result Value   Sed Rate 24 (*)    All other components within normal limits  COMPREHENSIVE METABOLIC PANEL - Abnormal; Notable for the following components:   Total Bilirubin 2.5 (*)    All other components within normal limits  CULTURE, BLOOD (ROUTINE X 2)  CULTURE, BLOOD (ROUTINE X 2)  URIC ACID  CBC WITH DIFFERENTIAL/PLATELET    EKG None  Radiology No results found.  Procedures Procedures (including critical care time)  Medications Ordered in ED Medications  diphenhydrAMINE (BENADRYL) capsule 25 mg (25 mg Oral Given 03/14/20 1753)  predniSONE (DELTASONE) tablet 60 mg (60 mg Oral Given 03/14/20 1947)  ibuprofen (ADVIL) tablet 600 mg (600 mg Oral Given 03/14/20 1945)    ED Course  I have reviewed the triage vital signs and the nursing notes.  Pertinent labs & imaging results that were available during my care of the patient were reviewed by me and considered in my medical decision making (see chart for details).  59 year old female presenting to the ED with myalgia, polyarthalgia, rash and fevers for 2 days  DDx includes autoimmune disease vs gout vs pseudogout vs other  Labs personally reviewed.  Uric acid 6.  CMP unremarkable (minor T-bili increase), CBC unremarkable, WBC 6.0.  I have a low suspicion for sepsis based on her exam  and vital signs.  She is clinically well appearing.   With her history of possible insect sting or bite to her elbow a few days prior to symptom onset, I felt it was reasonable to send off blood cultures.  I have a fairly low suspicion for bacteremia, but I do feel a rule-out is the safest plan.  She is clinically stable otherwise for discharge home.  We discussed a course of steroids and NSAIDS as this diffuse joint pain and swelling may be autoimmune or related to gout/pseudogout.  Regarding the abdominal pain - doubt cholecystitis or acute inflammatory  disease with normal WBC, minimal tenderness on exam.  Advised outpatient RUQ ultrasound via PCP, as she's had this pain in the past as well, and we can't exclude biliary colic.  Clinical Course as of Mar 15 54  [facility] Mar 14, 2020  2011 Labs unremarkable - doubt sepsis or acute cholecystitis at this time.  I would recommend PCP obtain RUQ ultrasound if she has persistent symptoms in the future.  Okay for discharge.   [MT]    Clinical Course User Index [MT] Adelaine Roppolo, Kermit Balo, MD    Final Clinical Impression(s) / ED Diagnoses Final diagnoses:  Joint swelling    Rx / DC Orders ED Discharge Orders         Ordered    methylPREDNISolone (MEDROL DOSEPAK) 4 MG TBPK tablet        03/14/20 2042    ibuprofen (ADVIL) 600 MG tablet  Every 6 hours PRN        03/14/20 2042           Terald Sleeper, MD 03/15/20 [phone]

## 2020-03-14 NOTE — ED Notes (Signed)
States for the past 2-3 days she has been having joint aches/ pains, also noted to have areas of redness at joints as well. Denies having fevers. Denies any new medications or changes in medication.

## 2020-03-14 NOTE — ED Notes (Signed)
States also that she has noted what she thinks is a "bug bite" at rt elbow, states it was red and swollen.

## 2020-03-14 NOTE — ED Triage Notes (Signed)
C/o joint pain x 2 days , also c/o bil leg swelling and h/a

## 2020-03-14 NOTE — Discharge Instructions (Signed)
Your blood cultures are pending.  If you test positive for a blood infection, someone should call you in 1-2 days to let you know.  You will need to get back to the ER immediately for IV antibiotics.  At this time I felt your risk for blood infection was very low given your clinical appearance and your bloodwork in the ER.  I felt it was reasonable to discharge you home.  You may be experiencing an autoimmune reaction with your joint swelling.  We talked about starting you on steroids for the next 10 days.  You can also take motrin 600 mg and tylenol 650 mg for aches and joint pain.  Finally, I would recommend that your primary care doctor order an outpatient ultrasound of your abdomen to look at your gallbladder and liver.  You may be experiencing some biliary colic or gallbladder pain in your stomach.

## 2020-03-17 ENCOUNTER — Other Ambulatory Visit (HOSPITAL_BASED_OUTPATIENT_CLINIC_OR_DEPARTMENT_OTHER): Payer: Self-pay | Admitting: Nurse Practitioner

## 2020-03-17 DIAGNOSIS — R1011 Right upper quadrant pain: Secondary | ICD-10-CM

## 2020-03-18 ENCOUNTER — Other Ambulatory Visit: Payer: Self-pay

## 2020-03-18 ENCOUNTER — Ambulatory Visit (HOSPITAL_BASED_OUTPATIENT_CLINIC_OR_DEPARTMENT_OTHER)
Admission: RE | Admit: 2020-03-18 | Discharge: 2020-03-18 | Disposition: A | Discharge status: Home / Self Care | Payer: BC Managed Care – PPO | Source: Ambulatory Visit | Attending: Nurse Practitioner | Admitting: Nurse Practitioner

## 2020-03-18 DIAGNOSIS — R1011 Right upper quadrant pain: Secondary | ICD-10-CM | POA: Diagnosis not present

## 2020-03-19 LAB — CULTURE, BLOOD (ROUTINE X 2)
Culture: NO GROWTH
Special Requests: ADEQUATE

## 2020-04-06 DIAGNOSIS — U071 COVID-19: Secondary | ICD-10-CM

## 2020-04-06 HISTORY — DX: COVID-19: U07.1

## 2020-04-21 ENCOUNTER — Other Ambulatory Visit: Payer: Self-pay | Admitting: Diagnostic Radiology

## 2020-04-21 ENCOUNTER — Other Ambulatory Visit: Payer: Self-pay | Admitting: Family Medicine

## 2020-04-21 DIAGNOSIS — R1011 Right upper quadrant pain: Secondary | ICD-10-CM

## 2020-05-07 ENCOUNTER — Emergency Department (HOSPITAL_BASED_OUTPATIENT_CLINIC_OR_DEPARTMENT_OTHER)
Admission: EM | Admit: 2020-05-07 | Discharge: 2020-05-07 | Disposition: A | Discharge status: Home / Self Care | Payer: BC Managed Care – PPO | Attending: Emergency Medicine | Admitting: Emergency Medicine

## 2020-05-07 ENCOUNTER — Emergency Department (HOSPITAL_BASED_OUTPATIENT_CLINIC_OR_DEPARTMENT_OTHER): Payer: BC Managed Care – PPO

## 2020-05-07 ENCOUNTER — Encounter (HOSPITAL_BASED_OUTPATIENT_CLINIC_OR_DEPARTMENT_OTHER): Payer: Self-pay | Admitting: Emergency Medicine

## 2020-05-07 DIAGNOSIS — R109 Unspecified abdominal pain: Secondary | ICD-10-CM | POA: Diagnosis present

## 2020-05-07 DIAGNOSIS — N3000 Acute cystitis without hematuria: Secondary | ICD-10-CM | POA: Diagnosis not present

## 2020-05-07 DIAGNOSIS — R17 Unspecified jaundice: Secondary | ICD-10-CM | POA: Diagnosis not present

## 2020-05-07 DIAGNOSIS — R000 Tachycardia, unspecified: Secondary | ICD-10-CM | POA: Insufficient documentation

## 2020-05-07 DIAGNOSIS — R1084 Generalized abdominal pain: Secondary | ICD-10-CM | POA: Insufficient documentation

## 2020-05-07 LAB — URINALYSIS, MICROSCOPIC (REFLEX)

## 2020-05-07 LAB — COMPREHENSIVE METABOLIC PANEL
ALT: 25 U/L (ref 0–44)
AST: 25 U/L (ref 15–41)
Albumin: 4.7 g/dL (ref 3.5–5.0)
Alkaline Phosphatase: 104 U/L (ref 38–126)
Anion gap: 10 (ref 5–15)
BUN: 16 mg/dL (ref 6–20)
CO2: 26 mmol/L (ref 22–32)
Calcium: 9.7 mg/dL (ref 8.9–10.3)
Chloride: 102 mmol/L (ref 98–111)
Creatinine, Ser: 0.97 mg/dL (ref 0.44–1.00)
GFR, Estimated: >60 mL/min (ref >60)
Glucose, Bld: 99 mg/dL (ref 70–99)
Potassium: 4 mmol/L (ref 3.5–5.1)
Sodium: 138 mmol/L (ref 135–145)
Total Bilirubin: 2.6 mg/dL — ABNORMAL HIGH (ref 0.3–1.2)
Total Protein: 8.1 g/dL (ref 6.5–8.1)

## 2020-05-07 LAB — URINALYSIS, ROUTINE W REFLEX MICROSCOPIC
Bilirubin Urine: NEGATIVE
Glucose, UA: NEGATIVE mg/dL
Hgb urine dipstick: NEGATIVE
Ketones, ur: NEGATIVE mg/dL
Nitrite: NEGATIVE
Protein, ur: NEGATIVE mg/dL
Specific Gravity, Urine: 1.02 (ref 1.005–1.030)
pH: 6.5 (ref 5.0–8.0)

## 2020-05-07 LAB — LIPASE, BLOOD: Lipase: 23 U/L (ref 11–51)

## 2020-05-07 LAB — CBC
HCT: 45.4 % (ref 36.0–46.0)
Hemoglobin: 14.8 g/dL (ref 12.0–15.0)
MCH: 30.1 pg (ref 26.0–34.0)
MCHC: 32.6 g/dL (ref 30.0–36.0)
MCV: 92.3 fL (ref 80.0–100.0)
Platelets: 287 10*3/uL (ref 150–400)
RBC: 4.92 MIL/uL (ref 3.87–5.11)
RDW: 12.4 % (ref 11.5–15.5)
WBC: 8.4 10*3/uL (ref 4.0–10.5)
nRBC: 0 % (ref 0.0–0.2)

## 2020-05-07 LAB — LACTIC ACID, PLASMA: Lactic Acid, Venous: 1 mmol/L (ref 0.5–1.9)

## 2020-05-07 MED ORDER — CEPHALEXIN 500 MG PO CAPS: 500.0000 mg | ORAL_CAPSULE | Freq: Two times a day (BID) | ORAL | 0 refills | Status: AC

## 2020-05-07 MED ORDER — ACETAMINOPHEN 325 MG PO TABS
650.0000 mg | ORAL_TABLET | Freq: Once | ORAL | Status: AC
  Administered 2020-05-07: 650 mg via ORAL
  Filled 2020-05-07: qty 2

## 2020-05-07 MED ORDER — ONDANSETRON HCL 4 MG/2ML IJ SOLN
4.0000 mg | Freq: Once | INTRAMUSCULAR | Status: AC
  Administered 2020-05-07: 4 mg via INTRAVENOUS
  Filled 2020-05-07: qty 2

## 2020-05-07 MED ORDER — IOHEXOL 300 MG/ML  SOLN
100.0000 mL | Freq: Once | INTRAMUSCULAR | Status: AC
  Administered 2020-05-07: 100 mL via INTRAVENOUS

## 2020-05-07 MED ORDER — SODIUM CHLORIDE 0.9 % IV BOLUS
1000.0000 mL | Freq: Once | INTRAVENOUS | Status: AC
  Administered 2020-05-07: 1000 mL via INTRAVENOUS

## 2020-05-07 MED ORDER — MORPHINE SULFATE (PF) 4 MG/ML IV SOLN
4.0000 mg | Freq: Once | INTRAVENOUS | Status: AC
  Administered 2020-05-07: 4 mg via INTRAVENOUS
  Filled 2020-05-07: qty 1

## 2020-05-07 NOTE — ED Provider Notes (Signed)
[facility] EMERGENCY DEPARTMENT Provider Note   CSN: [id number] Arrival date & time: 05/07/20  1052     History Chief Complaint  Patient presents with  . Abdominal Pain    Elaine Fletcher is a 60 y.o. female with PSHx hysterectomy who presents to the ED today with complaint of intermittent right sided abdominal pain radiating into back x 2-3 months however most recent flare 3-4 days ago. Pt reports that in August she began having acute pain to her right side - [place] and was told it was muscular in nature. She was discharged and advised to follow up with her PCP - she reports her PCP was concerned she could have rheumatoid arthritis and scheduled her to see a rheumatologist for further evaluation. She was also seen at another ED and had a RUQ ultrasound done without any acute findings. Pt reports the pain has been about a 2/10 intermittently for the past several months however worsened over the past few days causing her concern. She has a CT scan scheduled on 1/06 however her pain was unbearable last night prompting her to come to the ED today. Pt reports intermittent fevers as high as 101.0, nausea, and diarrhea. No vomiting. Pt denies any recent sick contacts or suspicious food intake. No recent abx use.   The history is provided by the patient and medical records.       History reviewed. No pertinent past medical history.  There are no problems to display for this patient.   Past Surgical History:  Procedure Laterality Date  . ABDOMINAL HYSTERECTOMY       OB History   No obstetric history on file.     No family history on file.  Social History   Tobacco Use  . Smoking status: Never Smoker  . Smokeless tobacco: Never Used  Substance Use Topics  . Alcohol use: No  . Drug use: No    Home Medications Prior to Admission medications   Medication Sig Start Date End Date Taking? Authorizing Provider   cephALEXin (KEFLEX) 500 MG capsule Take 1 capsule (500 mg total) by mouth 2 (two) times daily for 5 days. 05/07/20 05/12/20 Yes Damarys Speir, PA-C  ibuprofen (ADVIL) 600 MG tablet Take 1 tablet (600 mg total) by mouth every 6 (six) hours as needed for up to 30 doses for fever, mild pain or moderate pain. 03/14/20   Wyvonnia Dusky, MD  methylPREDNISolone (MEDROL DOSEPAK) 4 MG TBPK tablet Use as directed on package 03/15/20   Trifan, Carola Rhine, MD    Allergies    Bupropion  Review of Systems   Review of Systems  Constitutional: Positive for fever.  Gastrointestinal: Positive for abdominal pain, diarrhea and nausea. Negative for vomiting.  Genitourinary: Positive for flank pain.  All other systems reviewed and are negative.   Physical Exam Updated Vital Signs BP 128/70 (BP Location: Right Arm)   Pulse (!) 112   Temp 100 F (37.8 C) (Oral)   Resp 20   Ht 5\' 5"  (1.651 m)   Wt 90.7 kg   SpO2 98%   BMI 33.28 kg/m   Physical Exam Vitals and nursing note reviewed.  Constitutional:      Comments: Uncomfortable appearing female, clutching right side  HENT:     Head: Normocephalic and atraumatic.  Eyes:     Conjunctiva/sclera: Conjunctivae normal.  Cardiovascular:     Rate and Rhythm: Regular rhythm. Tachycardia  present.     Heart sounds: Normal heart sounds.  Pulmonary:     Effort: Pulmonary effort is normal.     Breath sounds: Normal breath sounds. No wheezing, rhonchi or rales.  Abdominal:     Palpations: Abdomen is soft.     Tenderness: There is generalized abdominal tenderness. There is right CVA tenderness and guarding (voluntary). There is no rebound.  Musculoskeletal:     Cervical back: Neck supple.  Skin:    General: Skin is warm and dry.  Neurological:     Mental Status: She is alert.     ED Results / Procedures / Treatments   Labs (all labs ordered are listed, but only abnormal results are displayed) Labs Reviewed  COMPREHENSIVE METABOLIC PANEL - Abnormal;  Notable for the following components:      Result Value   Total Bilirubin 2.6 (*)    All other components within normal limits  URINALYSIS, ROUTINE W REFLEX MICROSCOPIC - Abnormal; Notable for the following components:   APPearance HAZY (*)    Leukocytes,Ua LARGE (*)    All other components within normal limits  URINALYSIS, MICROSCOPIC (REFLEX) - Abnormal; Notable for the following components:   Bacteria, UA MANY (*)    All other components within normal limits  URINE CULTURE  LIPASE, BLOOD  CBC  LACTIC ACID, PLASMA    EKG None  Radiology CT Abdomen Pelvis W Contrast  Result Date: 05/07/2020 CLINICAL DATA:  Right lower quadrant abdomen pain. EXAM: CT ABDOMEN AND PELVIS WITH CONTRAST TECHNIQUE: Multidetector CT imaging of the abdomen and pelvis was performed using the standard protocol following bolus administration of intravenous contrast. CONTRAST:  OMNIPAQUE IOHEXOL 300 MG/ML  SOLN COMPARISON:  October 25, 2019 FINDINGS: Lower chest: No acute abnormality. Hepatobiliary: No focal liver abnormality is seen. No gallstones, gallbladder wall thickening, or biliary dilatation. Pancreas: Unremarkable. No pancreatic ductal dilatation or surrounding inflammatory changes. Spleen: Normal in size without focal abnormality. Adrenals/Urinary Tract: Adrenal glands are unremarkable. Kidneys are normal, without renal calculi, focal lesion, or hydronephrosis. Bladder is unremarkable. Stomach/Bowel: Minimal hiatal hernia is noted. Stomach is otherwise within normal limits. Appendix appears normal. No evidence of bowel wall thickening, distention, or inflammatory changes. Vascular/Lymphatic: No significant vascular findings are present. No enlarged abdominal or pelvic lymph nodes. Reproductive: Status post hysterectomy. No adnexal masses. Other: None. Musculoskeletal: No acute or significant osseous findings. IMPRESSION: 1. No acute abnormality identified in the abdomen and pelvis. The appendix is normal. 2.  Minimal hiatal hernia. Electronically Signed   By: Sherian Rein M.D.   On: 05/07/2020 13:40    Procedures Procedures (including critical care time)  Medications Ordered in ED Medications  sodium chloride 0.9 % bolus 1,000 mL (0 mLs Intravenous Stopped 05/07/20 1316)  ondansetron (ZOFRAN) injection 4 mg (4 mg Intravenous Given 05/07/20 1201)  morphine 4 MG/ML injection 4 mg (4 mg Intravenous Given 05/07/20 1201)  acetaminophen (TYLENOL) tablet 650 mg (650 mg Oral Given 05/07/20 1246)  iohexol (OMNIPAQUE) 300 MG/ML solution 100 mL (100 mLs Intravenous Contrast Given 05/07/20 1319)    ED Course  I have reviewed the triage vital signs and the nursing notes.  Pertinent labs & imaging results that were available during my care of the patient were reviewed by me and considered in my medical decision making (see chart for details).    MDM Rules/Calculators/A&P  60 year old female presents to the ED today with complaint of worsening right-sided abdominal pain radiating into right back that began 2 or 3 days ago.  Reports intermittent pain for the past several months with negative right upper quadrant ultrasound.  Plan for CT scan 5 days from now for further evaluation by PCP.  Pain was too unbearable this morning prompting her to come to the ED.  On arrival to the ED vitals include a temperature of 100.0, tachycardic at 112, nontachypneic.  Patient appears uncomfortable on exam and is clutching her right side.  On exam she has diffuse abdominal tenderness palpation however worse in the right upper quadrant and epigastric area.  She also has right CVA tenderness palpation.  She denies any history of kidney stones.  Past surgical history includes hysterectomy.  Concern for possible appendicitis given febrile with right-sided pain and a negative right upper quadrant ultrasound recently to rule out cholecystitis however this is also in the differential today.  I would suspect that patient  would have more complications if she were having an appendicitis given her pain has been intermittent since August of this year.  We will plan for CT scan at this time, lab work, fluids, pain medication, antiemetics.  If no acute findings patient may need GI referral.   CBC without leukocytosis at 8.4.  Hemoglobin stable at 14.8. CMP with t bili 2.6; no other LFT abnormalities. Pt had similar T bili when she had a negative RUQ ultrasound.  Lipase within normal limits at 23 Lactic acid 1.0 U/A with large leuks, 6-10 WBC, many bacteria however 6-10 epithelial cells. Will send for culture.   CT scan negative for any acute abnormalities at this time. No findings in the gallbladder; appendix normal. No ovarian masses.  Difficult to say what is causing pt's pain at this time. Given U/A looks mildly infectious will treat for UTI at this time. Pt will need to follow up with GI - she states that she has an appointment at [facility] in March. Will give info for [facility] to see if she can be seen sooner. May benefit from HIDA scan. Pt in agreement with plan and stable for discharge home.   This note was prepared using Dragon voice recognition software and may include unintentional dictation errors due to the inherent limitations of voice recognition software.  Final Clinical Impression(s) / ED Diagnoses Final diagnoses:  Abdominal pain, unspecified abdominal location  Acute cystitis without hematuria  Total bilirubin, elevated    Rx / DC Orders ED Discharge Orders         Ordered    cephALEXin (KEFLEX) 500 MG capsule  2 times daily        05/07/20 1454           Discharge Instructions     Please pick up antibiotics and take as prescribed to cover for a urinary tract infection as your urine did have bacteria in it today. We have sent it for culture and will call you in 2-3 days time       Tanda Rockers, Cordelia Poche 05/07/20 1500    Terrilee Files, MD 05/07/20 1743

## 2020-05-07 NOTE — ED Triage Notes (Addendum)
RLQ pain radiating into her back with nausea since 3am. States the pain has been intermittent since august. She has a CT scan scheduled.

## 2020-05-07 NOTE — Discharge Instructions (Signed)
Please pick up antibiotics and take as prescribed to cover for a urinary tract infection as your urine did have bacteria in it today. We have sent it for culture and will call you in 2-3 days time

## 2020-05-08 LAB — URINE CULTURE: Culture: NO GROWTH

## 2020-05-12 ENCOUNTER — Other Ambulatory Visit: Payer: BC Managed Care – PPO

## 2020-06-09 ENCOUNTER — Other Ambulatory Visit (HOSPITAL_COMMUNITY): Payer: Self-pay | Admitting: Gastroenterology

## 2020-06-09 ENCOUNTER — Other Ambulatory Visit: Payer: Self-pay | Admitting: Gastroenterology

## 2020-06-09 DIAGNOSIS — R1010 Upper abdominal pain, unspecified: Secondary | ICD-10-CM

## 2020-06-22 ENCOUNTER — Other Ambulatory Visit: Payer: Self-pay

## 2020-06-22 ENCOUNTER — Ambulatory Visit (HOSPITAL_COMMUNITY)
Admission: RE | Admit: 2020-06-22 | Discharge: 2020-06-22 | Disposition: A | Discharge status: Home / Self Care | Payer: BC Managed Care – PPO | Source: Ambulatory Visit | Attending: Gastroenterology | Admitting: Gastroenterology

## 2020-06-22 DIAGNOSIS — R1010 Upper abdominal pain, unspecified: Secondary | ICD-10-CM | POA: Insufficient documentation

## 2020-06-22 MED ORDER — TECHNETIUM TC 99M MEBROFENIN IV KIT
7.7000 | PACK | Freq: Once | INTRAVENOUS | Status: AC | PRN
  Administered 2020-06-22: 7.7 via INTRAVENOUS

## 2020-08-05 ENCOUNTER — Other Ambulatory Visit: Payer: Self-pay | Admitting: Surgery

## 2020-09-05 ENCOUNTER — Other Ambulatory Visit (HOSPITAL_COMMUNITY): Payer: BC Managed Care – PPO

## 2020-09-08 ENCOUNTER — Encounter (HOSPITAL_BASED_OUTPATIENT_CLINIC_OR_DEPARTMENT_OTHER): Payer: Self-pay | Admitting: Surgery

## 2020-09-08 ENCOUNTER — Other Ambulatory Visit: Payer: Self-pay

## 2020-09-08 NOTE — Progress Notes (Signed)
Spoke w/ via phone for pre-op interview---pt Lab needs dos----    none           Lab results------us carotid bil 02-13-2017 epic COVID test ------09-14-2020 1200 Arrive at -------530 am 09-15-2020 NPO after MN NO Solid Food.  Clear liquids from MN until---430 am then npo Med rec completed Medications to take morning of surgery -----none Diabetic medication -----n/a Patient instructed to bring photo id and insurance card day of surgery Patient aware to have Driver (ride ) / caregiver max menis family will drop pt off    for 24 hours after surgery  Patient Special Instructions -----none Pre-Op special Istructions -----none Patient verbalized understanding of instructions that were given at this phone interview. Patient denies shortness of breath, chest pain, fever, cough at this phone interview.

## 2020-09-12 ENCOUNTER — Other Ambulatory Visit (HOSPITAL_COMMUNITY): Payer: BC Managed Care – PPO

## 2020-09-14 ENCOUNTER — Other Ambulatory Visit (HOSPITAL_COMMUNITY)
Admission: RE | Admit: 2020-09-14 | Discharge: 2020-09-14 | Disposition: A | Discharge status: Home / Self Care | Payer: BC Managed Care – PPO | Source: Ambulatory Visit | Attending: Surgery | Admitting: Surgery

## 2020-09-14 DIAGNOSIS — Z01812 Encounter for preprocedural laboratory examination: Secondary | ICD-10-CM | POA: Insufficient documentation

## 2020-09-14 DIAGNOSIS — R1011 Right upper quadrant pain: Secondary | ICD-10-CM | POA: Diagnosis present

## 2020-09-14 DIAGNOSIS — Z20822 Contact with and (suspected) exposure to covid-19: Secondary | ICD-10-CM | POA: Diagnosis not present

## 2020-09-14 DIAGNOSIS — Z8379 Family history of other diseases of the digestive system: Secondary | ICD-10-CM | POA: Diagnosis not present

## 2020-09-14 DIAGNOSIS — K811 Chronic cholecystitis: Secondary | ICD-10-CM | POA: Diagnosis not present

## 2020-09-14 DIAGNOSIS — Z888 Allergy status to other drugs, medicaments and biological substances status: Secondary | ICD-10-CM | POA: Diagnosis not present

## 2020-09-14 LAB — SARS CORONAVIRUS 2 (TAT 6-24 HRS): SARS Coronavirus 2: NEGATIVE

## 2020-09-14 NOTE — Anesthesia Preprocedure Evaluation (Addendum)
Anesthesia Evaluation  Patient identified by MRN, date of birth, ID band Patient awake    Reviewed: Allergy & Precautions, NPO status , Patient's Chart, lab work & pertinent test results  Airway Mallampati: II  TM Distance: >3 FB Neck ROM: Full    Dental  (+) Dental Advisory Given, Teeth Intact   Pulmonary neg pulmonary ROS,    Pulmonary exam normal breath sounds clear to auscultation       Cardiovascular negative cardio ROS Normal cardiovascular exam Rhythm:Regular Rate:Normal     Neuro/Psych negative neurological ROS     GI/Hepatic Neg liver ROS, hiatal hernia, GERD  ,  Endo/Other  negative endocrine ROS  Renal/GU negative Renal ROS     Musculoskeletal negative musculoskeletal ROS (+)   Abdominal (+) + obese,   Peds  Hematology negative hematology ROS (+)   Anesthesia Other Findings   Reproductive/Obstetrics                            Anesthesia Physical Anesthesia Plan  ASA: II  Anesthesia Plan: General   Post-op Pain Management:    Induction: Intravenous  PONV Risk Score and Plan: 4 or greater and Ondansetron, Dexamethasone, Diphenhydramine, Midazolam, Treatment may vary due to age or medical condition and Aprepitant  Airway Management Planned: Oral ETT  Additional Equipment: None  Intra-op Plan:   Post-operative Plan: Extubation in OR  Informed Consent: I have reviewed the patients History and Physical, chart, labs and discussed the procedure including the risks, benefits and alternatives for the proposed anesthesia with the patient or authorized representative who has indicated his/her understanding and acceptance.     Dental advisory given  Plan Discussed with: CRNA  Anesthesia Plan Comments:        Anesthesia Quick Evaluation

## 2020-09-14 NOTE — H&P (Signed)
Elaine Fletcher  Location: [facility] Patient #: [id number] DOB: [birth year] Single / Language: [place] / Race: White Female   History of Present Illness  .  Chief complaint: Right upper quadrant abdominal pain  This is a 60 year old female referred to me by Dr. Dulce Sellar for evaluation of possible cholecystitis. For at least 6 months she's been having right upper quadrant abdominal pain hurting through to her back. She will have nausea with this. The pain is sharp and severe at times. It occurs after fatty meals. She has noted intermittent constipation and diarrhea. She has had no previous history of abdominal complaints until now. She had a unremarkable CAT scan, ultrasound, and HIDA scan. She has had laboratory that showed her bilirubin elevated above 2 on at least 2 separate occasions. She has a family history of gallbladder disease. She denies jaundice. She is at a previous hysterectomy.   Past Surgical History Hysterectomy (not due to cancer) - Partial   Diagnostic Studies History  Colonoscopy  1-5 years ago Mammogram  1-3 years ago Pap Smear  1-5 years ago  Allergies Wellbutrin *ANTIDEPRESSANTS*  Allergies Reconciled   Medication History  No Current Medications Medications Reconciled  Social History  Caffeine use  Coffee, Tea. No drug use  Tobacco use  Never smoker.  Family History  Breast Cancer  Mother.  Pregnancy / Birth History  Age at menarche  15 years. Age of menopause  53-50 Gravida  2 Maternal age  68-25 Para  2  Other Problems  Back Pain  Gastroesophageal Reflux Disease     Review of Systems  General Present- Night Sweats. Not Present- Appetite Loss, Chills, Fatigue, Fever, Weight Gain and Weight Loss. Skin Present- Dryness. Not Present- Change in Wart/Mole, Hives, Jaundice, New Lesions, Non-Healing Wounds, Rash and Ulcer. HEENT Present- Yellow Eyes. Not Present- Earache, Hearing Loss, Hoarseness, Nose  Bleed, Oral Ulcers, Ringing in the Ears, Seasonal Allergies, Sinus Pain, Sore Throat, Visual Disturbances and Wears glasses/contact lenses. Respiratory Not Present- Bloody sputum, Chronic Cough, Difficulty Breathing, Snoring and Wheezing. Breast Not Present- Breast Mass, Breast Pain, Nipple Discharge and Skin Changes. Cardiovascular Not Present- Chest Pain, Difficulty Breathing Lying Down, Leg Cramps, Palpitations, Rapid Heart Rate, Shortness of Breath and Swelling of Extremities. Gastrointestinal Present- Abdominal Pain, Change in Bowel Habits and Excessive gas. Not Present- Bloating, Bloody Stool, Chronic diarrhea, Constipation, Difficulty Swallowing, Gets full quickly at meals, Hemorrhoids, Indigestion, Nausea, Rectal Pain and Vomiting. Female Genitourinary Not Present- Frequency, Nocturia, Painful Urination, Pelvic Pain and Urgency. Neurological Not Present- Decreased Memory, Fainting, Headaches, Numbness, Seizures, Tingling, Tremor, Trouble walking and Weakness. Psychiatric Not Present- Anxiety, Bipolar, Change in Sleep Pattern, Depression, Fearful and Frequent crying. Endocrine Not Present- Cold Intolerance, Excessive Hunger, Hair Changes, Heat Intolerance, Hot flashes and New Diabetes. Hematology Not Present- Blood Thinners, Easy Bruising, Excessive bleeding, Gland problems, HIV and Persistent Infections.  Vitals   Weight: 203.2 lb Height: 65in Body Surface Area: 1.99 m Body Mass Index: 33.81 kg/m  Temp.: 97.50F  Pulse: 96 (Regular)  BP: 130/84(Sitting, Left Arm, Standard)     Physical Exam  The physical exam findings are as follows: Note: She appears well exam.  Her abdomen is soft. There is tenderness with guarding in the right upper quadrant. The rest of her abdomen is soft and nontender. There is no hepatomegaly. There are no hernias.  Lungs clear CV RRR Skin normal    Assessment & Plan  CHRONIC CHOLECYSTITIS (K81.1)  Impression: I reviewed her notes  from electronic  medical records. I have reviewed her previous ultrasound, HIDA scan, and CT scan. I believe based on her symptoms and exam she has chronic cholecystitis and I cannot completely rule out that she has had stones intermittently given her bilirubin. We recommend continued conservative management versus a laparoscopic cholecystectomy with cholangiogram. She wishes to proceed with surgery given the severity of her symptoms. I discussed the procedure in detail. The patient was given Agricultural engineer. We discussed the risks and benefits of a laparoscopic cholecystectomy and cholangiogram including, but not limited to, bleeding, infection, injury to surrounding structures such as the intestine or liver, bile leak, retained gallstones, need to convert to an open procedure, prolonged diarrhea, blood clots such as DVT, common bile duct injury, anesthesia risks, and possible need for additional procedures. The likelihood of improvement in symptoms and return to the patient's normal status is good. We discussed the typical post-operative recovery course. We also discussed the possibility this would not improve her symptoms. All questions were answered.

## 2020-09-15 ENCOUNTER — Encounter (HOSPITAL_BASED_OUTPATIENT_CLINIC_OR_DEPARTMENT_OTHER)
Admission: RE | Disposition: A | Discharge status: Home / Self Care | Payer: Self-pay | Source: Home / Self Care | Attending: Surgery

## 2020-09-15 ENCOUNTER — Other Ambulatory Visit: Payer: Self-pay

## 2020-09-15 ENCOUNTER — Encounter (HOSPITAL_BASED_OUTPATIENT_CLINIC_OR_DEPARTMENT_OTHER): Payer: Self-pay | Admitting: Surgery

## 2020-09-15 ENCOUNTER — Ambulatory Visit (HOSPITAL_BASED_OUTPATIENT_CLINIC_OR_DEPARTMENT_OTHER): Payer: BC Managed Care – PPO | Admitting: Anesthesiology

## 2020-09-15 ENCOUNTER — Ambulatory Visit (HOSPITAL_COMMUNITY)
Admission: RE | Admit: 2020-09-15 | Discharge: 2020-09-15 | Disposition: A | Discharge status: Home / Self Care | Payer: BC Managed Care – PPO | Attending: Surgery | Admitting: Surgery

## 2020-09-15 DIAGNOSIS — Z8379 Family history of other diseases of the digestive system: Secondary | ICD-10-CM | POA: Insufficient documentation

## 2020-09-15 DIAGNOSIS — K811 Chronic cholecystitis: Secondary | ICD-10-CM | POA: Diagnosis not present

## 2020-09-15 DIAGNOSIS — Z888 Allergy status to other drugs, medicaments and biological substances status: Secondary | ICD-10-CM | POA: Insufficient documentation

## 2020-09-15 DIAGNOSIS — Z20822 Contact with and (suspected) exposure to covid-19: Secondary | ICD-10-CM | POA: Insufficient documentation

## 2020-09-15 HISTORY — DX: Gastro-esophageal reflux disease without esophagitis: K21.9

## 2020-09-15 HISTORY — DX: Personal history of other diseases of the digestive system: Z87.19

## 2020-09-15 HISTORY — DX: Pain, unspecified: R52

## 2020-09-15 HISTORY — DX: Chronic cholecystitis: K81.1

## 2020-09-15 HISTORY — PX: CHOLECYSTECTOMY: SHX55

## 2020-09-15 SURGERY — LAPAROSCOPIC CHOLECYSTECTOMY
Anesthesia: General | Site: Abdomen

## 2020-09-15 MED ORDER — PROPOFOL 10 MG/ML IV BOLUS
INTRAVENOUS | Status: DC | PRN
  Administered 2020-09-15: 200 mg via INTRAVENOUS

## 2020-09-15 MED ORDER — ACETAMINOPHEN 500 MG PO TABS
1000.0000 mg | ORAL_TABLET | ORAL | Status: AC
  Administered 2020-09-15: 1000 mg via ORAL

## 2020-09-15 MED ORDER — CELECOXIB 200 MG PO CAPS
200.0000 mg | ORAL_CAPSULE | Freq: Once | ORAL | Status: AC
  Administered 2020-09-15: 200 mg via ORAL

## 2020-09-15 MED ORDER — HYDROMORPHONE HCL 1 MG/ML IJ SOLN
INTRAMUSCULAR | Status: AC
  Filled 2020-09-15: qty 1

## 2020-09-15 MED ORDER — OXYCODONE HCL 5 MG/5ML PO SOLN: 5.0000 mg | Freq: Once | ORAL | Status: DC | PRN

## 2020-09-15 MED ORDER — ACETAMINOPHEN 500 MG PO TABS: 1000.0000 mg | ORAL_TABLET | Freq: Once | ORAL | Status: DC

## 2020-09-15 MED ORDER — OXYCODONE HCL 5 MG PO TABS: 5.0000 mg | ORAL_TABLET | Freq: Four times a day (QID) | ORAL | 0 refills | Status: AC | PRN

## 2020-09-15 MED ORDER — CEFAZOLIN SODIUM-DEXTROSE 2-4 GM/100ML-% IV SOLN
INTRAVENOUS | Status: AC
  Filled 2020-09-15: qty 100

## 2020-09-15 MED ORDER — FENTANYL CITRATE (PF) 100 MCG/2ML IJ SOLN
INTRAMUSCULAR | Status: DC | PRN
  Administered 2020-09-15: 100 ug via INTRAVENOUS

## 2020-09-15 MED ORDER — DROPERIDOL 2.5 MG/ML IJ SOLN
0.6250 mg | Freq: Once | INTRAMUSCULAR | Status: AC | PRN
  Administered 2020-09-15: 0.625 mg via INTRAVENOUS

## 2020-09-15 MED ORDER — LIDOCAINE 2% (20 MG/ML) 5 ML SYRINGE
INTRAMUSCULAR | Status: DC | PRN
  Administered 2020-09-15: 60 mg via INTRAVENOUS

## 2020-09-15 MED ORDER — DEXAMETHASONE SODIUM PHOSPHATE 10 MG/ML IJ SOLN
INTRAMUSCULAR | Status: AC
  Filled 2020-09-15: qty 1

## 2020-09-15 MED ORDER — ACETAMINOPHEN 500 MG PO TABS
ORAL_TABLET | ORAL | Status: AC
  Filled 2020-09-15: qty 2

## 2020-09-15 MED ORDER — ROCURONIUM BROMIDE 10 MG/ML (PF) SYRINGE
PREFILLED_SYRINGE | INTRAVENOUS | Status: AC
  Filled 2020-09-15: qty 10

## 2020-09-15 MED ORDER — LIDOCAINE 2% (20 MG/ML) 5 ML SYRINGE
INTRAMUSCULAR | Status: AC
  Filled 2020-09-15: qty 5

## 2020-09-15 MED ORDER — OXYCODONE HCL 5 MG PO TABS: 5.0000 mg | ORAL_TABLET | Freq: Once | ORAL | Status: DC | PRN

## 2020-09-15 MED ORDER — ROCURONIUM BROMIDE 10 MG/ML (PF) SYRINGE
PREFILLED_SYRINGE | INTRAVENOUS | Status: DC | PRN
  Administered 2020-09-15: 50 mg via INTRAVENOUS

## 2020-09-15 MED ORDER — MIDAZOLAM HCL 2 MG/2ML IJ SOLN
INTRAMUSCULAR | Status: AC
  Filled 2020-09-15: qty 2

## 2020-09-15 MED ORDER — CELECOXIB 200 MG PO CAPS
ORAL_CAPSULE | ORAL | Status: AC
  Filled 2020-09-15: qty 1

## 2020-09-15 MED ORDER — 0.9 % SODIUM CHLORIDE (POUR BTL) OPTIME
TOPICAL | Status: DC | PRN
  Administered 2020-09-15: 500 mL

## 2020-09-15 MED ORDER — FENTANYL CITRATE (PF) 100 MCG/2ML IJ SOLN
INTRAMUSCULAR | Status: AC
  Filled 2020-09-15: qty 2

## 2020-09-15 MED ORDER — DROPERIDOL 2.5 MG/ML IJ SOLN
INTRAMUSCULAR | Status: AC
  Filled 2020-09-15: qty 2

## 2020-09-15 MED ORDER — CHLORHEXIDINE GLUCONATE CLOTH 2 % EX PADS: 6.0000 | MEDICATED_PAD | Freq: Once | CUTANEOUS | Status: DC

## 2020-09-15 MED ORDER — SODIUM CHLORIDE 0.9 % IR SOLN
Status: DC | PRN
  Administered 2020-09-15: 1000 mL

## 2020-09-15 MED ORDER — MEPERIDINE HCL 25 MG/ML IJ SOLN: 6.2500 mg | INTRAMUSCULAR | Status: DC | PRN

## 2020-09-15 MED ORDER — ONDANSETRON HCL 4 MG/2ML IJ SOLN
INTRAMUSCULAR | Status: AC
  Filled 2020-09-15: qty 2

## 2020-09-15 MED ORDER — MIDAZOLAM HCL 5 MG/5ML IJ SOLN
INTRAMUSCULAR | Status: DC | PRN
  Administered 2020-09-15: 2 mg via INTRAVENOUS

## 2020-09-15 MED ORDER — EPHEDRINE SULFATE-NACL 50-0.9 MG/10ML-% IV SOSY
PREFILLED_SYRINGE | INTRAVENOUS | Status: DC | PRN
  Administered 2020-09-15 (×2): 10 mg via INTRAVENOUS

## 2020-09-15 MED ORDER — SUGAMMADEX SODIUM 200 MG/2ML IV SOLN
INTRAVENOUS | Status: DC | PRN
  Administered 2020-09-15: 200 mg via INTRAVENOUS

## 2020-09-15 MED ORDER — APREPITANT 40 MG PO CAPS
ORAL_CAPSULE | ORAL | Status: AC
  Filled 2020-09-15: qty 1

## 2020-09-15 MED ORDER — BUPIVACAINE HCL 0.5 % IJ SOLN
INTRAMUSCULAR | Status: DC | PRN
  Administered 2020-09-15: 20 mL

## 2020-09-15 MED ORDER — ENSURE PRE-SURGERY PO LIQD: 296.0000 mL | Freq: Once | ORAL | Status: DC

## 2020-09-15 MED ORDER — CEFAZOLIN SODIUM-DEXTROSE 2-4 GM/100ML-% IV SOLN
2.0000 g | INTRAVENOUS | Status: AC
  Administered 2020-09-15: 2 g via INTRAVENOUS

## 2020-09-15 MED ORDER — APREPITANT 40 MG PO CAPS
40.0000 mg | ORAL_CAPSULE | Freq: Once | ORAL | Status: AC
  Administered 2020-09-15: 40 mg via ORAL

## 2020-09-15 MED ORDER — DEXAMETHASONE SODIUM PHOSPHATE 4 MG/ML IJ SOLN
INTRAMUSCULAR | Status: DC | PRN
  Administered 2020-09-15: 10 mg via INTRAVENOUS

## 2020-09-15 MED ORDER — LACTATED RINGERS IV SOLN: INTRAVENOUS | Status: DC

## 2020-09-15 MED ORDER — HYDROMORPHONE HCL 1 MG/ML IJ SOLN
0.2500 mg | INTRAMUSCULAR | Status: DC | PRN
  Administered 2020-09-15 (×3): 0.25 mg via INTRAVENOUS

## 2020-09-15 MED ORDER — ONDANSETRON HCL 4 MG/2ML IJ SOLN
INTRAMUSCULAR | Status: DC | PRN
  Administered 2020-09-15: 4 mg via INTRAVENOUS

## 2020-09-15 SURGICAL SUPPLY — 45 items
ADH SKN CLS APL DERMABOND .7 (GAUZE/BANDAGES/DRESSINGS) ×1
APL PRP STRL LF DISP 70% ISPRP (MISCELLANEOUS) ×1
APL SKNCLS STERI-STRIP NONHPOA (GAUZE/BANDAGES/DRESSINGS) ×1
APL SWBSTK 6 STRL LF DISP (MISCELLANEOUS) ×1
APPLICATOR COTTON TIP 6 STRL (MISCELLANEOUS) ×1 IMPLANT
APPLICATOR COTTON TIP 6IN STRL (MISCELLANEOUS) ×2
APPLIER CLIP 5 13 M/L LIGAMAX5 (MISCELLANEOUS) ×2
APR CLP MED LRG 5 ANG JAW (MISCELLANEOUS) ×1
BAG RETRIEVAL 10 (BASKET) ×1
BENZOIN TINCTURE PRP APPL 2/3 (GAUZE/BANDAGES/DRESSINGS) ×2 IMPLANT
BLADE CLIPPER SENSICLIP SURGIC (BLADE) IMPLANT
BLADE SURG 15 STRL LF DISP TIS (BLADE) ×1 IMPLANT
BLADE SURG 15 STRL SS (BLADE)
CABLE HIGH FREQUENCY MONO STRZ (ELECTRODE) ×2 IMPLANT
CANISTER SUCT 1200ML W/VALVE (MISCELLANEOUS) IMPLANT
CHLORAPREP W/TINT 26 (MISCELLANEOUS) ×2 IMPLANT
CLIP APPLIE 5 13 M/L LIGAMAX5 (MISCELLANEOUS) ×1 IMPLANT
COVER WAND RF STERILE (DRAPES) ×2 IMPLANT
DERMABOND ADVANCED (GAUZE/BANDAGES/DRESSINGS) ×1
DERMABOND ADVANCED .7 DNX12 (GAUZE/BANDAGES/DRESSINGS) ×1 IMPLANT
DISSECTOR BLUNT TIP ENDO 5MM (MISCELLANEOUS) IMPLANT
DRAPE C-ARM 42X120 X-RAY (DRAPES) ×2 IMPLANT
ELECT REM PT RETURN 9FT ADLT (ELECTROSURGICAL) ×2
ELECTRODE REM PT RTRN 9FT ADLT (ELECTROSURGICAL) ×1 IMPLANT
GLOVE SURG ENC MOIS LTX SZ6.5 (GLOVE) ×1 IMPLANT
GLOVE SURG ENC MOIS LTX SZ7.5 (GLOVE) ×1 IMPLANT
GLOVE SURG UNDER POLY LF SZ7.5 (GLOVE) ×2 IMPLANT
GOWN STRL REUS W/TWL LRG LVL3 (GOWN DISPOSABLE) ×2 IMPLANT
GOWN STRL REUS W/TWL XL LVL3 (GOWN DISPOSABLE) ×3 IMPLANT
HEMOSTAT SURGICEL 2X4 FIBR (HEMOSTASIS) IMPLANT
KIT TURNOVER CYSTO (KITS) ×2 IMPLANT
PACK BASIN DAY SURGERY FS (CUSTOM PROCEDURE TRAY) ×2 IMPLANT
SCISSORS LAP 5X35 DISP (ENDOMECHANICALS) ×2 IMPLANT
SET CHOLANGIOGRAPH MIX (MISCELLANEOUS) IMPLANT
SET SUCTION IRRIG HYDROSURG (IRRIGATION / IRRIGATOR) ×2 IMPLANT
SET TUBE SMOKE EVAC HIGH FLOW (TUBING) ×2 IMPLANT
SUT MON AB 4-0 PC3 18 (SUTURE) ×2 IMPLANT
SUT VICRYL 0 UR6 27IN ABS (SUTURE) ×2 IMPLANT
SYS BAG RETRIEVAL 10MM (BASKET) ×1
SYSTEM BAG RETRIEVAL 10MM (BASKET) IMPLANT
TOWEL OR 17X26 10 PK STRL BLUE (TOWEL DISPOSABLE) ×2 IMPLANT
TRAY LAPAROSCOPIC (CUSTOM PROCEDURE TRAY) ×2 IMPLANT
TROCAR XCEL BLADELESS 5X75MML (TROCAR) ×6 IMPLANT
TROCAR XCEL BLUNT TIP 100MML (ENDOMECHANICALS) ×2 IMPLANT
WATER STERILE IRR 500ML POUR (IV SOLUTION) ×1 IMPLANT

## 2020-09-15 NOTE — Interval H&P Note (Signed)
History and Physical Interval Note:no change in H and P  09/15/2020 7:08 AM  Elaine Fletcher  has presented today for surgery, with the diagnosis of CHRONIC CHOLECYSTITIS.  The various methods of treatment have been discussed with the patient and family. After consideration of risks, benefits and other options for treatment, the patient has consented to  Procedure(s): LAPAROSCOPIC CHOLECYSTECTOMY WITH CHOLANGIOGRAM (N/A) as a surgical intervention.  The patient's history has been reviewed, patient examined, no change in status, stable for surgery.  I have reviewed the patient's chart and labs.  Questions were answered to the patient's satisfaction.     Abigail Miyamoto

## 2020-09-15 NOTE — Discharge Instructions (Signed)
Post Anesthesia Home Care Instructions  Activity: Get plenty of rest for the remainder of the day. A responsible individual must stay with you for 24 hours following the procedure.  For the next 24 hours, DO NOT: -Drive a car -Advertising copywriter -Drink alcoholic beverages -Take any medication unless instructed by your physician -Make any legal decisions or sign important papers.  Meals: Start with liquid foods such as gelatin or soup. Progress to regular foods as tolerated. Avoid greasy, spicy, heavy foods. If nausea and/or vomiting occur, drink only clear liquids until the nausea and/or vomiting subsides. Call your physician if vomiting continues.  Special Instructions/Symptoms: Your throat may feel dry or sore from the anesthesia or the breathing tube placed in your throat during surgery. If this causes discomfort, gargle with warm salt water. The discomfort should disappear within 24 hours.  If you had a scopolamine patch placed behind your ear for the management of post- operative nausea and/or vomiting:  1. The medication in the patch is effective for 72 hours, after which it should be removed.  Wrap patch in a tissue and discard in the trash. Wash hands thoroughly with soap and water. 2. You may remove the patch earlier than 72 hours if you experience unpleasant side effects which may include dry mouth, dizziness or visual disturbances. 3. Avoid touching the patch. Wash your hands with soap and water after contact with the patch.    [facility] ______[facility] LAPAROSCOPIC SURGERY: POST OP INSTRUCTIONS Always review your discharge instruction sheet given to you by the facility where your surgery was performed. IF YOU HAVE DISABILITY OR FAMILY LEAVE FORMS, YOU MUST BRING THEM TO THE OFFICE FOR PROCESSING.   DO NOT GIVE THEM TO YOUR DOCTOR.  1. A prescription for pain medication may be given to you upon discharge.  Take your pain medication as prescribed, if needed.  If  narcotic pain medicine is not needed, then you may take acetaminophen (Tylenol) or ibuprofen (Advil) as needed. 2. Take your usually prescribed medications unless otherwise directed. 3. If you need a refill on your pain medication, please contact your pharmacy.  They will contact our office to request authorization. Prescriptions will not be filled after 5pm or on week-ends. 4. You should follow a light diet the first few days after arrival home, such as soup and crackers, etc.  Be sure to include lots of fluids daily. 5. Most patients will experience some swelling and bruising in the area of the incisions.  Ice packs will help.  Swelling and bruising can take several days to resolve.  6. It is common to experience some constipation if taking pain medication after surgery.  Increasing fluid intake and taking a stool softener (such as Colace) will usually help or prevent this problem from occurring.  A mild laxative (Milk of Magnesia or Miralax) should be taken according to package instructions if there are no bowel movements after 48 hours. 7. Unless discharge instructions indicate otherwise, you may remove your bandages 24-48 hours after surgery, and you may shower at that time.  You may have steri-strips (small skin tapes) in place directly over the incision.  These strips should be left on the skin for 7-10 days.  If your surgeon used skin glue on the incision, you may shower in 24 hours.  The glue will flake off over the next 2-3 weeks.  Any sutures or staples will be removed at the office during your follow-up visit. 8. ACTIVITIES:  You may resume regular (  light) daily activities beginning the next day--such as daily self-care, walking, climbing stairs--gradually increasing activities as tolerated.  You may have sexual intercourse when it is comfortable.  Refrain from any heavy lifting or straining until approved by your doctor. a. You may drive when you are no longer taking prescription pain  medication, you can comfortably wear a seatbelt, and you can safely maneuver your car and apply brakes. b. RETURN TO WORK:  __________________________________________________________ 9. You should see your doctor in the office for a follow-up appointment approximately 2-3 weeks after your surgery.  Make sure that you call for this appointment within a day or two after you arrive home to insure a convenient appointment time. 10. OTHER INSTRUCTIONS:ok to shower starting tomorrow 11. Ice pack, tylenol, and ibuprofen also for pain 12. No lifting more than 15 pounds for 2 weeks __________________________________________________________________________________________________________________________ __________________________________________________________________________________________________________________________ WHEN TO CALL YOUR DOCTOR: 1. Fever over 101.0 2. Inability to urinate 3. Continued bleeding from incision. 4. Increased pain, redness, or drainage from the incision. 5. Increasing abdominal pain  The clinic staff is available to answer your questions during regular business hours.  Please don't hesitate to call and ask to speak to one of the nurses for clinical concerns.  If you have a medical emergency, go to the nearest emergency room or call 911.  A surgeon from [facility] is always on call at the hospital. [address], [place], [place]  [address] ? [address] [address], [place], [place]   [address] [phone] ? [phone] ? FAX [phone] Web site: [email or web address]

## 2020-09-15 NOTE — Op Note (Signed)
Laparoscopic Cholecystectomy Procedure Note  Indications: This patient presents with RUQ symptoms, elevated total bilirubin, a family history of gallbladder disease, and will undergo laparoscopic cholecystectomy.  Pre-operative Diagnosis: Abdominal pain, right upper quadrant  Post-operative Diagnosis: Same  Surgeon: Dr. Abigail Miyamoto, MD   Assistants: Dr. Luan Pulling, MD, PGY4  Anesthesia: General endotracheal anesthesia  ASA Class: 2  Procedure Details  The patient was seen again in the Holding Room. The risks, benefits, complications, treatment options, and expected outcomes were discussed with the patient. The possibilities of reaction to medication, pulmonary aspiration, perforation of viscus, bleeding, recurrent infection, finding a normal gallbladder, the need for additional procedures, failure to diagnose a condition, the possible need to convert to an open procedure, and creating a complication requiring transfusion or operation were discussed with the patient. The likelihood of improving the patient's symptoms with return to their baseline status is good.  The patient and/or family concurred with the proposed plan, giving informed consent. The site of surgery properly noted. The patient was taken to Operating Room, identified as Emmit Alexanders and the procedure verified as Laparoscopic Cholecystectomy with Intraoperative Cholangiogram. A Time Out was held and the above information confirmed.  Prior to the induction of general anesthesia, antibiotic prophylaxis was administered. General endotracheal anesthesia was then administered and tolerated well. After the induction, the abdomen was prepped with Chloraprep and draped in sterile fashion. The patient was positioned in the supine position.  Local anesthetic agent was injected into the skin near the umbilicus and an incision made. We dissected down to the abdominal fascia with blunt dissection.  The fascia was incised  vertically and we entered the peritoneal cavity bluntly.  A pursestring suture of 0-Vicryl was placed around the fascial opening.  The Hasson cannula was inserted and secured with the stay suture.  Pneumoperitoneum was then created with CO2 and tolerated well without any adverse changes in the patient's vital signs. A 5-mm port was placed in the subxiphoid position.  Two 5-mm ports were placed in the right upper quadrant.   We positioned the patient in reverse Trendelenburg, tilted slightly to the patient's left.  The gallbladder was identified, the fundus grasped and retracted cephalad.  The infundibulum was grasped and retracted laterally, exposing the peritoneum overlying the triangle of Calot. This was then divided and exposed in a blunt fashion. The cystic duct was clearly identified and bluntly dissected circumferentially. A critical view of the cystic duct and cystic artery was obtained.  A cholangiogram was attempted but the cystic duct was extremely diminutive and unable to reliably accommodate the [facility]. This procedure was aborted for safety. The cystic duct was clipped three times below the ductotomy and again on the gallbladder. This was divided with endoshears. The artery was clipped twice to leave two on the patient side and then divided with endoshears.  The gallbladder was dissected from the liver bed in retrograde fashion with the electrocautery. A small amount of normal-appearing bile was spilled. The gallbladder was removed and placed in an Endocatch bag. The liver bed was irrigated and inspected. Hemostasis was achieved with the electrocautery. Copious irrigation was utilized and was repeatedly aspirated until clear.  The gallbladder and Endocatch bag were then removed through the umbilical port site.  The pursestring suture was used to close the umbilical fascia.    We again inspected the right upper quadrant for hemostasis.  Pneumoperitoneum was released as we removed the  trocars.  4-0 Monocryl was used to close the skin. Local  anesthetic was instilled at all port sites. Dermabond was applied. The patient was then extubated and brought to the recovery room in stable condition. Instrument, sponge, and needle counts were correct at closure and at the conclusion of the case.   Findings:  - tiny cystic duct, unable to reliably cannulate for intraoperative cholangiogram; IOC aborted  - small spillage of normal-appearing bile and no stones; suctioned until clear  - gallbladder mildly thickened but otherwise unremarkable in appearance  - liver grossly normal in appearance  Estimated Blood Loss: Minimal         Drains: None         Specimens: Gallbladder           Complications: None; patient tolerated the procedure well.         Disposition: PACU - hemodynamically stable.         Condition: stable

## 2020-09-15 NOTE — Transfer of Care (Signed)
Immediate Anesthesia Transfer of Care Note  Patient: Elaine Fletcher  Procedure(s) Performed: LAPAROSCOPIC CHOLECYSTECTOMY WITH CHOLANGIOGRAM (N/A Abdomen)  Patient Location: PACU  Anesthesia Type:General  Level of Consciousness: drowsy  Airway & Oxygen Therapy: Patient Spontanous Breathing and Patient connected to face mask oxygen  Post-op Assessment: Report given to RN and Post -op Vital signs reviewed and stable  Post vital signs: Reviewed and stable  Last Vitals:  Vitals Value Taken Time  BP 141/76 09/15/20 0831  Temp 36.3 C 09/15/20 0831  Pulse 85 09/15/20 0833  Resp 22 09/15/20 0833  SpO2 96 % 09/15/20 0833  Vitals shown include unvalidated device data.  Last Pain:  Vitals:   09/15/20 0545  PainSc: 2       Patients Stated Pain Goal: 2 (09/15/20 0545)  Complications: No complications documented.

## 2020-09-15 NOTE — Anesthesia Procedure Notes (Signed)
Procedure Name: Intubation Date/Time: 09/15/2020 7:35 AM Performed by: Montez Morita, Kaitelyn Jamison W, CRNA Pre-anesthesia Checklist: Patient identified, Emergency Drugs available, Suction available and Patient being monitored Patient Re-evaluated:Patient Re-evaluated prior to induction Oxygen Delivery Method: Circle system utilized Preoxygenation: Pre-oxygenation with 100% oxygen Induction Type: IV induction Ventilation: Mask ventilation without difficulty Laryngoscope Size: Miller and 2 Grade View: Grade I Tube type: Oral Tube size: 7.0 mm Number of attempts: 1 Airway Equipment and Method: Stylet Placement Confirmation: ETT inserted through vocal cords under direct vision,  positive ETCO2 and breath sounds checked- equal and bilateral Secured at: 22 cm Tube secured with: Tape Dental Injury: Teeth and Oropharynx as per pre-operative assessment

## 2020-09-16 ENCOUNTER — Encounter (HOSPITAL_BASED_OUTPATIENT_CLINIC_OR_DEPARTMENT_OTHER): Payer: Self-pay | Admitting: Surgery

## 2020-09-16 LAB — SURGICAL PATHOLOGY

## 2020-09-16 NOTE — Anesthesia Postprocedure Evaluation (Signed)
Anesthesia Post Note  Patient: Elaine Fletcher  Procedure(s) Performed: LAPAROSCOPIC CHOLECYSTECTOMY WITH CHOLANGIOGRAM (N/A Abdomen)     Patient location during evaluation: PACU Anesthesia Type: General Level of consciousness: sedated and patient cooperative Pain management: pain level controlled Vital Signs Assessment: post-procedure vital signs reviewed and stable Respiratory status: spontaneous breathing Cardiovascular status: stable Anesthetic complications: no   No complications documented.  Last Vitals:  Vitals:   09/15/20 0945 09/15/20 1020  BP: 126/71 111/68  Pulse: 66 68  Resp: 14 16  Temp:  (!) 36.3 C  SpO2: 90% 93%    Last Pain:  Vitals:   09/16/20 1031  PainSc: 3                  Odeth Bry [employer]

## 2021-09-13 ENCOUNTER — Other Ambulatory Visit (HOSPITAL_BASED_OUTPATIENT_CLINIC_OR_DEPARTMENT_OTHER): Payer: Self-pay | Admitting: Family Medicine

## 2021-09-13 DIAGNOSIS — R1011 Right upper quadrant pain: Secondary | ICD-10-CM

## 2021-09-21 ENCOUNTER — Telehealth (HOSPITAL_BASED_OUTPATIENT_CLINIC_OR_DEPARTMENT_OTHER): Payer: Self-pay

## 2021-09-25 ENCOUNTER — Ambulatory Visit (HOSPITAL_BASED_OUTPATIENT_CLINIC_OR_DEPARTMENT_OTHER)
Admission: RE | Admit: 2021-09-25 | Discharge: 2021-09-25 | Disposition: A | Discharge status: Home / Self Care | Payer: BC Managed Care – PPO | Source: Ambulatory Visit | Attending: Family Medicine | Admitting: Family Medicine

## 2021-09-25 DIAGNOSIS — R1011 Right upper quadrant pain: Secondary | ICD-10-CM | POA: Diagnosis present

## 2021-10-11 ENCOUNTER — Other Ambulatory Visit: Payer: Self-pay | Admitting: Gastroenterology

## 2021-10-11 DIAGNOSIS — R109 Unspecified abdominal pain: Secondary | ICD-10-CM

## 2021-10-24 ENCOUNTER — Emergency Department (HOSPITAL_BASED_OUTPATIENT_CLINIC_OR_DEPARTMENT_OTHER): Payer: BC Managed Care – PPO

## 2021-10-24 ENCOUNTER — Other Ambulatory Visit: Payer: Self-pay

## 2021-10-24 ENCOUNTER — Encounter (HOSPITAL_BASED_OUTPATIENT_CLINIC_OR_DEPARTMENT_OTHER): Payer: Self-pay

## 2021-10-24 ENCOUNTER — Emergency Department (HOSPITAL_BASED_OUTPATIENT_CLINIC_OR_DEPARTMENT_OTHER)
Admission: EM | Admit: 2021-10-24 | Discharge: 2021-10-24 | Disposition: A | Discharge status: Home / Self Care | Payer: BC Managed Care – PPO | Attending: Emergency Medicine | Admitting: Emergency Medicine

## 2021-10-24 DIAGNOSIS — R1012 Left upper quadrant pain: Secondary | ICD-10-CM | POA: Diagnosis not present

## 2021-10-24 DIAGNOSIS — R079 Chest pain, unspecified: Secondary | ICD-10-CM

## 2021-10-24 DIAGNOSIS — R0682 Tachypnea, not elsewhere classified: Secondary | ICD-10-CM | POA: Insufficient documentation

## 2021-10-24 DIAGNOSIS — R0789 Other chest pain: Secondary | ICD-10-CM | POA: Diagnosis not present

## 2021-10-24 LAB — CBC
HCT: 45.8 % (ref 36.0–46.0)
Hemoglobin: 14.8 g/dL (ref 12.0–15.0)
MCH: 29.9 pg (ref 26.0–34.0)
MCHC: 32.3 g/dL (ref 30.0–36.0)
MCV: 92.5 fL (ref 80.0–100.0)
Platelets: 278 10*3/uL (ref 150–400)
RBC: 4.95 MIL/uL (ref 3.87–5.11)
RDW: 12.6 % (ref 11.5–15.5)
WBC: 6.9 10*3/uL (ref 4.0–10.5)
nRBC: 0 % (ref 0.0–0.2)

## 2021-10-24 LAB — COMPREHENSIVE METABOLIC PANEL
ALT: 22 U/L (ref 0–44)
AST: 21 U/L (ref 15–41)
Albumin: 4.5 g/dL (ref 3.5–5.0)
Alkaline Phosphatase: 79 U/L (ref 38–126)
Anion gap: 13 (ref 5–15)
BUN: 19 mg/dL (ref 6–20)
CO2: 24 mmol/L (ref 22–32)
Calcium: 10.5 mg/dL — ABNORMAL HIGH (ref 8.9–10.3)
Chloride: 105 mmol/L (ref 98–111)
Creatinine, Ser: 1.06 mg/dL — ABNORMAL HIGH (ref 0.44–1.00)
GFR, Estimated: >60 mL/min (ref >60)
Glucose, Bld: 96 mg/dL (ref 70–99)
Potassium: 3.8 mmol/L (ref 3.5–5.1)
Sodium: 142 mmol/L (ref 135–145)
Total Bilirubin: 1.5 mg/dL — ABNORMAL HIGH (ref 0.3–1.2)
Total Protein: 7.7 g/dL (ref 6.5–8.1)

## 2021-10-24 LAB — TROPONIN I (HIGH SENSITIVITY): Troponin I (High Sensitivity): 5 ng/L (ref <18)

## 2021-10-24 LAB — D-DIMER, QUANTITATIVE: D-Dimer, Quant: 0.47 ug/mL-FEU (ref 0.00–0.50)

## 2021-10-24 MED ORDER — KETOROLAC TROMETHAMINE 15 MG/ML IJ SOLN
15.0000 mg | Freq: Once | INTRAMUSCULAR | Status: AC
  Administered 2021-10-24: 15 mg via INTRAVENOUS
  Filled 2021-10-24: qty 1

## 2021-10-24 NOTE — ED Triage Notes (Signed)
Pt states chest pain started last night and got worse located on left side and radiates from nipple to back. States she is nauseated. States she is [facility] and dizzy. States pain is 8/10. States hx of indigestion.

## 2021-10-24 NOTE — Discharge Instructions (Signed)
All the testing is normal today.  No signs of heart attack or blood clots.  Suspected something in your muscles and chest wall.  You can take Tylenol as needed for the pain.  However if you start passing out have severe pain to where you are vomiting or you cannot catch her breath please return to the emergency room.

## 2021-10-24 NOTE — ED Provider Notes (Signed)
[facility] EMERGENCY DEPT Provider Note   CSN: [id number] Arrival date & time: 10/24/21  1032     History  Chief Complaint  Patient presents with   Chest Pain    Elaine Fletcher is a 61 y.o. female.  Patient is a 61 year old female with a history of of chronic abdominal pain, GERD and prior cholecystectomy who is presenting today with pain in the left side of her chest that radiates into her shoulder.  She reports that she woke up in the middle of the night with this pain and it has been persistent.  It does seem to be worse with movement and taking a deep breath.  She did take an aspirin at home without improved symptoms.  She has had nausea but denies any vomiting.  She has had some mild shortness of breath and feeling a little bit lightheaded.  She denies a prior history of cardiac disease but father at 21 had a heart attack.  She also reports her mom had a history of blood clots but she has never had clots.  She does report a trip to the beach where she was in a car for 3 hours and noticed today that she had some bruising and tenderness on her left leg and thought she saw a spider and was concerned she may have been bitten.  She denies any fever has had a dry cough and no nasal congestion.  The history is provided by the patient.  Chest Pain      Home Medications Prior to Admission medications   Medication Sig Start Date End Date Taking? Authorizing Provider  dicyclomine (BENTYL) 10 MG capsule Take 10 mg by mouth 3 (three) times daily. 10/11/21   [provider]  naproxen sodium (ALEVE) 220 MG tablet Take 220 mg by mouth daily as needed.    [provider]  oxyCODONE (OXY IR/ROXICODONE) 5 MG immediate release tablet Take 1 tablet (5 mg total) by mouth every 6 (six) hours as needed for moderate pain or severe pain. 09/15/20   Abigail Miyamoto, MD      Allergies    Bupropion    Review of Systems   Review of Systems  Cardiovascular:  Positive  for chest pain.    Physical Exam Updated Vital Signs BP 128/75   Pulse 79   Temp 97.8 F (36.6 C) (Oral)   Resp 17   Ht 5\' 4"  (1.626 m)   Wt 92.1 kg   SpO2 96%   BMI 34.85 kg/m  Physical Exam Vitals and nursing note reviewed.  Constitutional:      Appearance: She is well-developed.     Comments: Anxious and appears somewhat uncomfortable  HENT:     Head: Normocephalic and atraumatic.  Eyes:     Pupils: Pupils are equal, round, and reactive to light.  Cardiovascular:     Rate and Rhythm: Normal rate and regular rhythm.     Pulses: Normal pulses.     Heart sounds: Normal heart sounds. No murmur heard.    No friction rub.  Pulmonary:     Effort: Pulmonary effort is normal. Tachypnea present.     Breath sounds: Normal breath sounds. No wheezing or rales.  Chest:     Chest wall: Tenderness present.  Abdominal:     General: Bowel sounds are normal. There is no distension.     Palpations: Abdomen is soft.     Tenderness: There is abdominal tenderness in the left upper quadrant. There is no  left CVA tenderness, guarding or rebound.  Musculoskeletal:        General: No tenderness. Normal range of motion.     Right lower leg: No edema.     Left lower leg: No edema.       Legs:     Comments: No edema.  1 x 2 cm area of ecchymosis with tenderness on the proximal lateral left lower leg  Skin:    General: Skin is warm and dry.     Findings: No rash.  Neurological:     Mental Status: She is alert and oriented to person, place, and time. Mental status is at baseline.     Cranial Nerves: No cranial nerve deficit.  Psychiatric:        Mood and Affect: Mood is anxious.     ED Results / Procedures / Treatments   Labs (all labs ordered are listed, but only abnormal results are displayed) Labs Reviewed  COMPREHENSIVE METABOLIC PANEL - Abnormal; Notable for the following components:      Result Value   Creatinine, Ser 1.06 (*)    Calcium 10.5 (*)    Total Bilirubin 1.5 (*)     All other components within normal limits  CBC  D-DIMER, QUANTITATIVE  TROPONIN I (HIGH SENSITIVITY)  TROPONIN I (HIGH SENSITIVITY)    EKG EKG Interpretation  Date/Time:  Tuesday October 24 2021 10:41:21 EDT Ventricular Rate:  86 PR Interval:  134 QRS Duration: 93 QT Interval:  369 QTC Calculation: 442 R Axis:   66 Text Interpretation: Sinus rhythm Minimal ST depression, diffuse leads Baseline wander in lead(s) V4 V5 V6 Confirmed by Gwyneth Sprout ([id number]) on 10/24/2021 10:52:25 AM  Radiology DG Chest Port 1 View  Result Date: 10/24/2021 CLINICAL DATA:  Left-sided chest pain. EXAM: PORTABLE CHEST 1 VIEW COMPARISON:  Chest x-ray dated July 18, 2014. FINDINGS: The heart size and mediastinal contours are within normal limits. Normal pulmonary vascularity. No focal consolidation, pleural effusion, or pneumothorax. New mild elevation of the right hemidiaphragm. No acute osseous abnormality. IMPRESSION: 1. No active disease. Electronically Signed   By: Obie Dredge M.D.   On: 10/24/2021 11:01    Procedures Procedures    Medications Ordered in ED Medications  ketorolac (TORADOL) 15 MG/ML injection 15 mg (15 mg Intravenous Given 10/24/21 1127)    ED Course/ Medical Decision Making/ A&P                           Medical Decision Making Amount and/or Complexity of Data Reviewed Labs: ordered. Decision-making details documented in ED Course. Radiology: ordered and independent interpretation performed. Decision-making details documented in ED Course. ECG/medicine tests: ordered and independent interpretation performed. Decision-making details documented in ED Course.  Risk Prescription drug management.   Patient is presenting today with nonspecific chest pain.  Started in the middle the night does seem to have a pleuritic and movement component to it but also is having nausea and feels short of breath.  She has some mild left upper quadrant pain as well on exam.  Has a history  of chronic abdominal pain status postcholecystectomy.  Concern for possible electrolyte abnormalities versus PE versus atypical ACS.  Patient is low risk Wells score but did have a recent trip where she was immobilized for 3 hours in the car.  Does have a family history but no personal history of cardiac disease and does not use tobacco products or have history of hypertension, diabetes or  hyperlipidemia.  I independently interpreted patient's EKG today which shows a minimally depressed ST waves throughout which does not appear significantly different from an EKG in 2012.  Heart score of 2-3 make pt low risk.  12:57 PM I independently interpreted patient's labs today and CBC, CMP, troponin and D-dimer are all within normal limits except for calcium elevated at 10.5 which patient is aware of. I have independently visualized and interpreted pt's images today. Chest x-ray today without acute findings.  Pain was improved after Toradol.  Patient had an endoscopy 2 weeks ago that showed no acute findings for ulcers.  Suspect musculoskeletal origin versus GI.  Low suspicion at this time for ACS, PE, dissection.  Findings discussed with the patient and her husband.  Feel that she is stable for discharge and no indication for admission at this time.        Final Clinical Impression(s) / ED Diagnoses Final diagnoses:  Nonspecific chest pain    Rx / DC Orders ED Discharge Orders     None         Gwyneth Sprout, MD 10/24/21 1257

## 2021-11-03 ENCOUNTER — Ambulatory Visit
Admission: RE | Admit: 2021-11-03 | Discharge: 2021-11-03 | Disposition: A | Discharge status: Home / Self Care | Payer: BC Managed Care – PPO | Source: Ambulatory Visit | Attending: Gastroenterology | Admitting: Gastroenterology

## 2021-11-03 DIAGNOSIS — R109 Unspecified abdominal pain: Secondary | ICD-10-CM

## 2021-11-03 MED ORDER — IOPAMIDOL (ISOVUE-300) INJECTION 61%
100.0000 mL | Freq: Once | INTRAVENOUS | Status: AC | PRN
  Administered 2021-11-03: 100 mL via INTRAVENOUS

## 2021-11-20 ENCOUNTER — Other Ambulatory Visit: Payer: Self-pay | Admitting: Internal Medicine

## 2021-11-20 DIAGNOSIS — Z13820 Encounter for screening for osteoporosis: Secondary | ICD-10-CM

## 2021-11-24 ENCOUNTER — Other Ambulatory Visit: Payer: Self-pay | Admitting: Family Medicine

## 2021-11-24 ENCOUNTER — Ambulatory Visit
Admission: RE | Admit: 2021-11-24 | Discharge: 2021-11-24 | Disposition: A | Discharge status: Home / Self Care | Payer: BC Managed Care – PPO | Source: Ambulatory Visit | Attending: Internal Medicine | Admitting: Internal Medicine

## 2021-11-24 DIAGNOSIS — Z13820 Encounter for screening for osteoporosis: Secondary | ICD-10-CM

## 2021-11-24 DIAGNOSIS — Z1231 Encounter for screening mammogram for malignant neoplasm of breast: Secondary | ICD-10-CM

## 2021-11-27 ENCOUNTER — Ambulatory Visit: Payer: BC Managed Care – PPO

## 2021-12-12 ENCOUNTER — Ambulatory Visit
Admission: RE | Admit: 2021-12-12 | Discharge: 2021-12-12 | Disposition: A | Discharge status: Home / Self Care | Payer: BC Managed Care – PPO | Source: Ambulatory Visit | Attending: Family Medicine | Admitting: Family Medicine

## 2021-12-12 DIAGNOSIS — Z1231 Encounter for screening mammogram for malignant neoplasm of breast: Secondary | ICD-10-CM

## 2021-12-26 ENCOUNTER — Other Ambulatory Visit (HOSPITAL_COMMUNITY): Payer: Self-pay | Admitting: Family Medicine

## 2022-01-09 ENCOUNTER — Encounter (HOSPITAL_COMMUNITY): Payer: Self-pay

## 2022-01-09 ENCOUNTER — Encounter (HOSPITAL_COMMUNITY): Payer: BC Managed Care – PPO

## 2024-02-25 DIAGNOSIS — D225 Melanocytic nevi of trunk: Secondary | ICD-10-CM | POA: Diagnosis not present

## 2024-02-25 DIAGNOSIS — L308 Other specified dermatitis: Secondary | ICD-10-CM | POA: Diagnosis not present

## 2024-02-25 DIAGNOSIS — L309 Dermatitis, unspecified: Secondary | ICD-10-CM | POA: Diagnosis not present

## 2024-03-24 DIAGNOSIS — J019 Acute sinusitis, unspecified: Secondary | ICD-10-CM | POA: Diagnosis not present

## 2024-03-25 DIAGNOSIS — J069 Acute upper respiratory infection, unspecified: Secondary | ICD-10-CM | POA: Diagnosis not present
